# Patient Record
Sex: Female | Born: 1995 | Race: White | Hispanic: No | Marital: Single | Smoking: Former smoker
Health system: Southern US, Community
[De-identification: ages and names within clinical notes are randomized; demographics above are authoritative.]

## PROBLEM LIST (undated history)

## (undated) DIAGNOSIS — Z87820 Personal history of traumatic brain injury: Secondary | ICD-10-CM

## (undated) DIAGNOSIS — J0301 Acute recurrent streptococcal tonsillitis: Secondary | ICD-10-CM

## (undated) DIAGNOSIS — F32A Depression, unspecified: Secondary | ICD-10-CM

## (undated) DIAGNOSIS — I729 Aneurysm of unspecified site: Secondary | ICD-10-CM

## (undated) DIAGNOSIS — F329 Major depressive disorder, single episode, unspecified: Secondary | ICD-10-CM

## (undated) DIAGNOSIS — K589 Irritable bowel syndrome without diarrhea: Secondary | ICD-10-CM

## (undated) DIAGNOSIS — J302 Other seasonal allergic rhinitis: Secondary | ICD-10-CM

## (undated) DIAGNOSIS — F909 Attention-deficit hyperactivity disorder, unspecified type: Secondary | ICD-10-CM

## (undated) DIAGNOSIS — G43909 Migraine, unspecified, not intractable, without status migrainosus: Secondary | ICD-10-CM

## (undated) DIAGNOSIS — S0300XA Dislocation of jaw, unspecified side, initial encounter: Secondary | ICD-10-CM

## (undated) DIAGNOSIS — F419 Anxiety disorder, unspecified: Secondary | ICD-10-CM

## (undated) HISTORY — DX: Attention-deficit hyperactivity disorder, unspecified type: F90.9

## (undated) HISTORY — DX: Major depressive disorder, single episode, unspecified: F32.9

## (undated) HISTORY — DX: Depression, unspecified: F32.A

## (undated) HISTORY — DX: Irritable bowel syndrome, unspecified: K58.9

## (undated) HISTORY — DX: Dislocation of jaw, unspecified side, initial encounter: S03.00XA

## (undated) HISTORY — DX: Aneurysm of unspecified site: I72.9

## (undated) HISTORY — DX: Migraine, unspecified, not intractable, without status migrainosus: G43.909

## (undated) HISTORY — PX: WISDOM TOOTH EXTRACTION: SHX21

## (undated) HISTORY — DX: Anxiety disorder, unspecified: F41.9

## (undated) HISTORY — PX: INTRAUTERINE DEVICE (IUD) INSERTION: SHX5877

---
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
## 2014-01-01 ENCOUNTER — Encounter: Payer: Self-pay | Admitting: *Deleted

## 2014-01-30 ENCOUNTER — Other Ambulatory Visit: Payer: Self-pay | Admitting: Physician Assistant

## 2014-01-30 DIAGNOSIS — F988 Other specified behavioral and emotional disorders with onset usually occurring in childhood and adolescence: Secondary | ICD-10-CM

## 2014-01-30 MED ORDER — LISDEXAMFETAMINE DIMESYLATE 50 MG PO CAPS: 50.0000 mg | ORAL_CAPSULE | Freq: Every day | ORAL | Status: DC

## 2014-02-15 ENCOUNTER — Encounter: Payer: Self-pay | Admitting: Physician Assistant

## 2014-02-15 ENCOUNTER — Ambulatory Visit (INDEPENDENT_AMBULATORY_CARE_PROVIDER_SITE_OTHER): Payer: BC Managed Care – PPO | Admitting: Physician Assistant

## 2014-02-15 VITALS — BP 100/64 | HR 90 | Temp 98.0°F | Resp 16 | Ht 63.5 in | Wt 137.0 lb

## 2014-02-15 DIAGNOSIS — F411 Generalized anxiety disorder: Secondary | ICD-10-CM

## 2014-02-15 DIAGNOSIS — F32A Depression, unspecified: Secondary | ICD-10-CM

## 2014-02-15 DIAGNOSIS — R5383 Other fatigue: Secondary | ICD-10-CM

## 2014-02-15 DIAGNOSIS — F988 Other specified behavioral and emotional disorders with onset usually occurring in childhood and adolescence: Secondary | ICD-10-CM

## 2014-02-15 DIAGNOSIS — F329 Major depressive disorder, single episode, unspecified: Secondary | ICD-10-CM

## 2014-02-15 DIAGNOSIS — F909 Attention-deficit hyperactivity disorder, unspecified type: Secondary | ICD-10-CM

## 2014-02-15 DIAGNOSIS — Z79899 Other long term (current) drug therapy: Secondary | ICD-10-CM

## 2014-02-15 DIAGNOSIS — G43809 Other migraine, not intractable, without status migrainosus: Secondary | ICD-10-CM

## 2014-02-15 LAB — HEPATIC FUNCTION PANEL
ALT: <8 U/L (ref 0–35)
AST: 15 U/L (ref 0–37)
Albumin: 4.2 g/dL (ref 3.5–5.2)
Alkaline Phosphatase: 65 U/L (ref 39–117)
Bilirubin, Direct: 0.1 mg/dL (ref 0.0–0.3)
Indirect Bilirubin: 0.1 mg/dL — ABNORMAL LOW (ref 0.2–1.1)
Total Bilirubin: 0.2 mg/dL (ref 0.2–1.1)
Total Protein: 6.8 g/dL (ref 6.0–8.3)

## 2014-02-15 LAB — CBC WITH DIFFERENTIAL/PLATELET
Basophils Absolute: 0 10*3/uL (ref 0.0–0.1)
Basophils Relative: 0 % (ref 0–1)
Eosinophils Absolute: 0.1 10*3/uL (ref 0.0–0.7)
Eosinophils Relative: 2 % (ref 0–5)
HCT: 37.7 % (ref 36.0–46.0)
Hemoglobin: 12.7 g/dL (ref 12.0–15.0)
Lymphocytes Relative: 36 % (ref 12–46)
Lymphs Abs: 2.4 10*3/uL (ref 0.7–4.0)
MCH: 29.7 pg (ref 26.0–34.0)
MCHC: 33.7 g/dL (ref 30.0–36.0)
MCV: 88.1 fL (ref 78.0–100.0)
MPV: 8.9 fL — ABNORMAL LOW (ref 9.4–12.4)
Monocytes Absolute: 0.8 10*3/uL (ref 0.1–1.0)
Monocytes Relative: 12 % (ref 3–12)
Neutro Abs: 3.3 10*3/uL (ref 1.7–7.7)
Neutrophils Relative %: 50 % (ref 43–77)
Platelets: 455 10*3/uL — ABNORMAL HIGH (ref 150–400)
RBC: 4.28 MIL/uL (ref 3.87–5.11)
RDW: 13.3 % (ref 11.5–15.5)
WBC: 6.6 10*3/uL (ref 4.0–10.5)

## 2014-02-15 LAB — BASIC METABOLIC PANEL WITH GFR
BUN: 11 mg/dL (ref 6–23)
CO2: 27 mEq/L (ref 19–32)
Calcium: 9.5 mg/dL (ref 8.4–10.5)
Chloride: 102 mEq/L (ref 96–112)
Creat: 0.74 mg/dL (ref 0.50–1.10)
GFR, Est African American: >89 mL/min
GFR, Est Non African American: >89 mL/min
Glucose, Bld: 75 mg/dL (ref 70–99)
Potassium: 4.2 mEq/L (ref 3.5–5.3)
Sodium: 137 mEq/L (ref 135–145)

## 2014-02-15 LAB — TSH: TSH: 2.615 u[IU]/mL (ref 0.350–4.500)

## 2014-02-15 MED ORDER — LISDEXAMFETAMINE DIMESYLATE 50 MG PO CAPS: 50.0000 mg | ORAL_CAPSULE | Freq: Every day | ORAL | Status: DC

## 2014-02-15 NOTE — Progress Notes (Signed)
Subjective:    Patient ID: Derrek GuKrista A Fleer, female    DOB: 08/13/1995, 18 y.o.   MRN: [id number]  HPI A Caucasian 18yo female presents to the office today due to wanting to get MRI due to possible concussion that occurred 3 weeks ago and migraines.  Patient also wants to discuss her ADD and depression medication.    Patient states she fell and hit her head hard about 3 weeks ago.  She states she went to provider at [facility] and they told her she had concussion and to do "brain rest" for 5 days; so patient missed 5 days of school.  Patient states she is doing better and does not have symptoms anymore.  Patient and her mother are concerned about the chornic migraines that are occuring and want to get MRI and scheduled appointment with Dr. Clarisse GougeLewit (Neurologist) on 02/28/14.  Patient states the migraines come and go.  Patient states her headaches occur like "band around head".    Patient has [facility] August 2015 and was put on Wellbutrin.  Patient states insurance only covered two visits and she has not gone back due to cost.  She states the Wellbutrin helps with depression and helped "stabilize her mood", but not the anxiety.  Patient also has ADD and was on Adderall 20mg , but is now on VyVanse.  She states the Vyvanse is not working and she thinks it is causing migraines, end of day headache and anxiety.  She states it only works for 2 hours and it makes her "feel bad" with no benefits.  She states she was taking Adderall 20mg - 2 tablets in morning and 2 tablets in afternoon.  She states she want to be put back on Adderall, so that she can control it and take the medication when needed. GFR= >89 on 06/28/13 LMP= 01/23/14.  Cycles are regular.  She is sexually active.  Not on birth control, but states she does use condoms.   Review of Systems  Constitutional: Positive for fatigue. Negative for fever, chills and diaphoresis.  Eyes: Negative.   Respiratory: Negative.    Cardiovascular: Positive for palpitations. Negative for chest pain.  Gastrointestinal: Negative.   Genitourinary: Negative.   Skin: Negative.  Negative for rash.  Neurological: Positive for headaches. Negative for dizziness.  Psychiatric/Behavioral: Negative for suicidal ideas. The patient is nervous/anxious.        Depression and ADD.  Denies SI or HI thoughts.   Past Medical History  Diagnosis Date  . Asthma   . Migraines   . IBS (irritable bowel syndrome)   . H/O cold sores      Medication List       This list is accurate as of: 02/15/14  9:58 AM.  Always use your most recent med list.               acyclovir 400 MG tablet  Commonly known as:  ZOVIRAX  Take 400 mg by mouth 2 (two) times daily.     buPROPion 150 MG 24 hr tablet  Commonly known as:  WELLBUTRIN XL  Take 150 mg by mouth daily.     lisdexamfetamine 50 MG capsule  Commonly known as:  VYVANSE  Take 1 capsule (50 mg total) by mouth daily.       Allergies  Allergen Reactions  . Penicillins   . Sulfa Antibiotics      BP 100/64 mmHg  Pulse 90  Temp(Src) 98 F (36.7 C) (Temporal)  Resp 16  Ht 5' 3.5" (1.613 m)  Wt 137 lb (62.143 kg)  BMI 23.88 kg/m2  SpO2 98%  LMP 01/23/2014 Wt Readings from Last 3 Encounters:  02/15/14 137 lb (62.143 kg) (65 %*, Z = 0.38)  06/28/13 118 lb 3.2 oz (53.615 kg) (32 %*, Z = -0.46)  05/03/13 129 lb (58.514 kg) (55 %*, Z = 0.12)   * Growth percentiles are based on CDC 2-20 Years data.   Objective:   Physical Exam  Constitutional: She is oriented to person, place, and time. She appears well-developed and well-nourished. She does not have a sickly appearance. No distress.  HENT:  Head: Normocephalic.  Right Ear: Tympanic membrane, external ear and ear canal normal.  Left Ear: Tympanic membrane, external ear and ear canal normal.  Nose: Nose normal. Right sinus exhibits no maxillary sinus tenderness and no frontal sinus tenderness. Left sinus exhibits no maxillary  sinus tenderness and no frontal sinus tenderness.  Mouth/Throat: Uvula is midline, oropharynx is clear and moist and mucous membranes are normal. Mucous membranes are not pale and not dry. No trismus in the jaw. No uvula swelling. No oropharyngeal exudate, posterior oropharyngeal edema, posterior oropharyngeal erythema or tonsillar abscesses.  Eyes: Conjunctivae, EOM and lids are normal. Pupils are equal, round, and reactive to light. Right eye exhibits no discharge. Left eye exhibits no discharge. No scleral icterus.  Neck: Trachea normal, normal range of motion and phonation normal. Neck supple. No tracheal tenderness present. No tracheal deviation present. No thyroid mass and no thyromegaly present.  Cardiovascular: Normal rate, regular rhythm, S1 normal, S2 normal, normal heart sounds, intact distal pulses and normal pulses.  Exam reveals no gallop, no distant heart sounds and no friction rub.   No murmur heard. Pulmonary/Chest: Effort normal and breath sounds normal. No stridor. No respiratory distress. She has no decreased breath sounds. She has no wheezes. She has no rhonchi. She has no rales. She exhibits no tenderness.  Abdominal: Soft. Bowel sounds are normal. She exhibits no distension, no pulsatile midline mass and no mass. There is no tenderness. There is no rebound and no guarding.  Lymphadenopathy:  No tenderness or LAD.  Neurological: She is alert and oriented to person, place, and time. She has normal strength. No cranial nerve deficit or sensory deficit. Coordination and gait normal.  Skin: Skin is warm, dry and intact. No rash noted. She is not diaphoretic.  Psychiatric: She has a normal mood and affect. Her speech is normal and behavior is normal. Judgment and thought content normal. Cognition and memory are normal.  Vitals reviewed.  Assessment & Plan:  1. Other migraine without status migrainosus, not intractable Please keep your appt with Neurologist on 02/28/14.  Told patient  that we do not order MRIs on patients when they are going to see Neurologist.  Especially when neuro exam is normal.  2. ADD (attention deficit disorder) -Continue Vyvanse as prescribed -Gave patient paper prescription post dated for 02/28/14 due to patient living on campus and holidays. Told patient that post dating RX can only be done once- lisdexamfetamine (VYVANSE) 50 MG capsule; Take 1 capsule (50 mg total) by mouth daily.  Dispense: 30 capsule; Refill: 0  3. Generalized anxiety disorder and Depression and Fatigue Continue Wellbutrin XL as prescribed.  Ordered TSH to check thyroid since last checked 7 months ago. - TSH  4. Encounter for long-term (current) use of medications Will monitor kidney and liver function. - TSH - BASIC METABOLIC PANEL WITH GFR - Hepatic  function panel - CBC with Differential  Told patient that she needs to see psychiatrist (Dr. Evelene Croon or [facility]) before next appointment/refill.  Told her that they can help manage her Depression, Anxiety and ADD medications better than we can especially due to her symptoms/side effects from medications.  Discussed medication effects and SE's.  Pt agreed to treatment plan. Please keep your physical appt on 07/04/14.  Avni Traore, Lise Auer, PA-C 10:32 AM [facility]

## 2014-02-15 NOTE — Patient Instructions (Addendum)
-Continue all medications as prescribed  Please call one of the psychiatrists below before your next appointment.  They can help manage your medications better than we can as they are the experts.  Dr. Milagros Evenerupinder Kaur [phone]- [phone]  Recommend that you see [facility] to help manage your depression, anxiety and stress better. Address: [address]     [place], [place] [address] [phone]  Please keep your appointment with Neurologist on 02/28/14.  The neurologist are particular when it comes to ordering MRIs and they are faster at getting insurance to cover costs for MRIs.  Please keep your physical in May 2016  Attention Deficit Hyperactivity Disorder Attention deficit hyperactivity disorder (ADHD) is a problem with behavior issues based on the way the brain functions (neurobehavioral disorder). It is a common reason for behavior and academic problems in school. SYMPTOMS  There are 3 types of ADHD. The 3 types and some of the symptoms include:  Inattentive.  Gets bored or distracted easily.  Loses or forgets things. Forgets to hand in homework.  Has trouble organizing or completing tasks.  Difficulty staying on task.  An inability to organize daily tasks and school work.  Leaving projects, chores, or homework unfinished.  Trouble paying attention or responding to details. Careless mistakes.  Difficulty following directions. Often seems like is not listening.  Dislikes activities that require sustained attention (like chores or homework).  Hyperactive-impulsive.  Feels like it is impossible to sit still or stay in a seat. Fidgeting with hands and feet.  Trouble waiting turn.  Talking too much or out of turn. Interruptive.  Speaks or acts impulsively.  Aggressive, disruptive behavior.  Constantly busy or on the go; noisy.  Often leaves seat when they are expected to remain seated.  Often runs or climbs where it is not appropriate, or feels  very restless.  Combined.  Has symptoms of both of the above. Often children with ADHD feel discouraged about themselves and with school. They often perform well below their abilities in school. As children get older, the excess motor activities can calm down, but the problems with paying attention and staying organized persist. Most children do not outgrow ADHD but with good treatment can learn to cope with the symptoms. DIAGNOSIS  When ADHD is suspected, the diagnosis should be made by professionals trained in ADHD. This professional will collect information about the individual suspected of having ADHD. Information must be collected from various settings where the person lives, works, or attends school.  Diagnosis will include:  Confirming symptoms began in childhood.  Ruling out other reasons for the child's behavior.  The health care providers will check with the child's school and check their medical records.  They will talk to teachers and parents.  Behavior rating scales for the child will be filled out by those dealing with the child on a daily basis. A diagnosis is made only after all information has been considered. TREATMENT  Treatment usually includes behavioral treatment, tutoring or extra support in school, and stimulant medicines. Because of the way a person's brain works with ADHD, these medicines decrease impulsivity and hyperactivity and increase attention. This is different than how they would work in a person who does not have ADHD. Other medicines used include antidepressants and certain blood pressure medicines. Most experts agree that treatment for ADHD should address all aspects of the person's functioning. Along with medicines, treatment should include structured classroom management at school. Parents should reward good behavior, provide constant discipline, and  set limits. Tutoring should be available for the child as needed. ADHD is a lifelong condition. If  untreated, the disorder can have long-term serious effects into adolescence and adulthood. HOME CARE INSTRUCTIONS   Often with ADHD there is a lot of frustration among family members dealing with the condition. Blame and anger are also feelings that are common. In many cases, because the problem affects the family as a whole, the entire family may need help. A therapist can help the family find better ways to handle the disruptive behaviors of the person with ADHD and promote change. If the person with ADHD is young, most of the therapist's work is with the parents. Parents will learn techniques for coping with and improving their child's behavior. Sometimes only the child with the ADHD needs counseling. Your health care providers can help you make these decisions.  Children with ADHD may need help learning how to organize. Some helpful tips include:  Keep routines the same every day from wake-up time to bedtime. Schedule all activities, including homework and playtime. Keep the schedule in a place where the person with ADHD will often see it. Mark schedule changes as far in advance as possible.  Schedule outdoor and indoor recreation.  Have a place for everything and keep everything in its place. This includes clothing, backpacks, and school supplies.  Encourage writing down assignments and bringing home needed books. Work with your child's teachers for assistance in organizing school work.  Offer your child a well-balanced diet. Breakfast that includes a balance of whole grains, protein, and fruits or vegetables is especially important for school performance. Children should avoid drinks with caffeine including:  Soft drinks.  Coffee.  Tea.  However, some older children (adolescents) may find these drinks helpful in improving their attention. Because it can also be common for adolescents with ADHD to become addicted to caffeine, talk with your health care provider about what is a safe amount  of caffeine intake for your child.  Children with ADHD need consistent rules that they can understand and follow. If rules are followed, give small rewards. Children with ADHD often receive, and expect, criticism. Look for good behavior and praise it. Set realistic goals. Give clear instructions. Look for activities that can foster success and self-esteem. Make time for pleasant activities with your child. Give lots of affection.  Parents are their children's greatest advocates. Learn as much as possible about ADHD. This helps you become a stronger and better advocate for your child. It also helps you educate your child's teachers and instructors if they feel inadequate in these areas. Parent support groups are often helpful. A national group with local chapters is called Children and Adults with Attention Deficit Hyperactivity Disorder (CHADD). SEEK MEDICAL CARE IF:  Your child has repeated muscle twitches, cough, or speech outbursts.  Your child has sleep problems.  Your child has a marked loss of appetite.  Your child develops depression.  Your child has new or worsening behavioral problems.  Your child develops dizziness.  Your child has a racing heart.  Your child has stomach pains.  Your child develops headaches. SEEK IMMEDIATE MEDICAL CARE IF:  Your child has been diagnosed with depression or anxiety and the symptoms seem to be getting worse.  Your child has been depressed and suddenly appears to have increased energy or motivation.  You are worried that your child is having a bad reaction to a medication he or she is taking for ADHD. Document Released: 02/06/2002 Document Revised:  02/21/2013 Document Reviewed: 10/24/2012 [facility] Patient Information 2015 [place], [place]. This information is not intended to replace advice given to you by your health care provider. Make sure you discuss any questions you have with your health care provider.

## 2014-02-21 ENCOUNTER — Ambulatory Visit: Payer: Self-pay | Admitting: Physician Assistant

## 2014-02-26 ENCOUNTER — Other Ambulatory Visit: Payer: Self-pay

## 2014-02-26 ENCOUNTER — Other Ambulatory Visit: Payer: Self-pay | Admitting: Physician Assistant

## 2014-02-27 ENCOUNTER — Other Ambulatory Visit: Payer: Self-pay

## 2014-02-27 MED ORDER — BUPROPION HCL ER (XL) 150 MG PO TB24: ORAL_TABLET | ORAL | Status: DC

## 2014-02-28 ENCOUNTER — Other Ambulatory Visit: Payer: Self-pay | Admitting: Neurology

## 2014-02-28 DIAGNOSIS — R51 Headache: Principal | ICD-10-CM

## 2014-02-28 DIAGNOSIS — R519 Headache, unspecified: Secondary | ICD-10-CM

## 2014-03-01 ENCOUNTER — Ambulatory Visit
Admission: RE | Admit: 2014-03-01 | Discharge: 2014-03-01 | Disposition: A | Discharge status: Home / Self Care | Payer: BC Managed Care – PPO | Source: Ambulatory Visit | Attending: Neurology | Admitting: Neurology

## 2014-03-01 DIAGNOSIS — R519 Headache, unspecified: Secondary | ICD-10-CM

## 2014-03-01 DIAGNOSIS — R51 Headache: Principal | ICD-10-CM

## 2014-03-20 ENCOUNTER — Encounter: Payer: Self-pay | Admitting: Physician Assistant

## 2014-07-04 ENCOUNTER — Encounter: Payer: Self-pay | Admitting: Physician Assistant

## 2014-08-09 ENCOUNTER — Encounter: Payer: Self-pay | Admitting: Sports Medicine

## 2014-08-09 ENCOUNTER — Encounter: Payer: Self-pay | Admitting: Internal Medicine

## 2014-08-09 ENCOUNTER — Ambulatory Visit (INDEPENDENT_AMBULATORY_CARE_PROVIDER_SITE_OTHER): Payer: BLUE CROSS/BLUE SHIELD | Admitting: Internal Medicine

## 2014-08-09 ENCOUNTER — Emergency Department (HOSPITAL_COMMUNITY): Payer: BLUE CROSS/BLUE SHIELD

## 2014-08-09 ENCOUNTER — Encounter (HOSPITAL_COMMUNITY): Payer: Self-pay | Admitting: *Deleted

## 2014-08-09 ENCOUNTER — Emergency Department (HOSPITAL_COMMUNITY)
Admission: EM | Admit: 2014-08-09 | Discharge: 2014-08-09 | Disposition: A | Discharge status: Home / Self Care | Payer: BLUE CROSS/BLUE SHIELD | Attending: Emergency Medicine | Admitting: Emergency Medicine

## 2014-08-09 VITALS — BP 106/70 | HR 100 | Temp 97.9°F | Resp 16

## 2014-08-09 DIAGNOSIS — S32030A Wedge compression fracture of third lumbar vertebra, initial encounter for closed fracture: Secondary | ICD-10-CM | POA: Diagnosis not present

## 2014-08-09 DIAGNOSIS — S06320S Contusion and laceration of left cerebrum without loss of consciousness, sequela: Secondary | ICD-10-CM

## 2014-08-09 DIAGNOSIS — K589 Irritable bowel syndrome without diarrhea: Secondary | ICD-10-CM | POA: Insufficient documentation

## 2014-08-09 DIAGNOSIS — R200 Anesthesia of skin: Secondary | ICD-10-CM | POA: Diagnosis present

## 2014-08-09 DIAGNOSIS — Y9289 Other specified places as the place of occurrence of the external cause: Secondary | ICD-10-CM | POA: Insufficient documentation

## 2014-08-09 DIAGNOSIS — W1839XA Other fall on same level, initial encounter: Secondary | ICD-10-CM | POA: Diagnosis not present

## 2014-08-09 DIAGNOSIS — Z8619 Personal history of other infectious and parasitic diseases: Secondary | ICD-10-CM | POA: Diagnosis not present

## 2014-08-09 DIAGNOSIS — R531 Weakness: Secondary | ICD-10-CM | POA: Insufficient documentation

## 2014-08-09 DIAGNOSIS — G43909 Migraine, unspecified, not intractable, without status migrainosus: Secondary | ICD-10-CM | POA: Insufficient documentation

## 2014-08-09 DIAGNOSIS — S0210XS Unspecified fracture of base of skull, sequela: Secondary | ICD-10-CM

## 2014-08-09 DIAGNOSIS — S22080A Wedge compression fracture of T11-T12 vertebra, initial encounter for closed fracture: Secondary | ICD-10-CM | POA: Insufficient documentation

## 2014-08-09 DIAGNOSIS — Z79899 Other long term (current) drug therapy: Secondary | ICD-10-CM | POA: Insufficient documentation

## 2014-08-09 DIAGNOSIS — M4850XA Collapsed vertebra, not elsewhere classified, site unspecified, initial encounter for fracture: Secondary | ICD-10-CM

## 2014-08-09 DIAGNOSIS — S22000S Wedge compression fracture of unspecified thoracic vertebra, sequela: Secondary | ICD-10-CM

## 2014-08-09 DIAGNOSIS — Z880 Allergy status to penicillin: Secondary | ICD-10-CM | POA: Diagnosis not present

## 2014-08-09 DIAGNOSIS — Y9389 Activity, other specified: Secondary | ICD-10-CM | POA: Insufficient documentation

## 2014-08-09 DIAGNOSIS — IMO0001 Reserved for inherently not codable concepts without codable children: Secondary | ICD-10-CM

## 2014-08-09 DIAGNOSIS — S06310S Contusion and laceration of right cerebrum without loss of consciousness, sequela: Secondary | ICD-10-CM

## 2014-08-09 DIAGNOSIS — Y998 Other external cause status: Secondary | ICD-10-CM | POA: Insufficient documentation

## 2014-08-09 MED ORDER — ONDANSETRON HCL 4 MG/2ML IJ SOLN
4.0000 mg | Freq: Once | INTRAMUSCULAR | Status: AC
  Administered 2014-08-09: 4 mg via INTRAVENOUS
  Filled 2014-08-09: qty 2

## 2014-08-09 MED ORDER — LORAZEPAM 2 MG/ML IJ SOLN
1.0000 mg | Freq: Once | INTRAMUSCULAR | Status: AC
  Administered 2014-08-09: 1 mg via INTRAVENOUS
  Filled 2014-08-09: qty 1

## 2014-08-09 MED ORDER — MORPHINE SULFATE 4 MG/ML IJ SOLN
4.0000 mg | Freq: Once | INTRAMUSCULAR | Status: AC
  Administered 2014-08-09: 4 mg via INTRAVENOUS
  Filled 2014-08-09: qty 1

## 2014-08-09 NOTE — Progress Notes (Signed)
Subjective:    Patient ID: Hannah Reeves, female    DOB: 06-04-1995, 19 y.o.   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 She had no admitted LOC, nor focal neuro signs or sx's and was admitted to a trauma unit, then step and finally a regular floor as she was observred and activity was advanced. Then 7 July she was released and wearing a TLSO brace she reportedly had a very uncomfortable trip back to [employer]. She presents today with c/o new pains  & weakness of her lower extremities - more specifically her medial & lateral thighs . She did ambulate here today and arrived in the examine room via wheel chair. She's c/o a global HA w/o focal signs.     Numerous CTscans, MRI and XR reports were reviewed from here recent hospitalization from accompanying CD's from her recent hospitalization..  Medication Sig  . buPROPion XL 150 MG 24 hr tablet TAKE 1 TABLET BY MOUTH ONCE EVERY MORNING (Patient taking differently: Take 150 mg by mouth daily. )  . acyclovir  400 MG tablet Take 400 mg by mouth 2 (two) times daily.  Marland Kitchen lisdexamfetamine50 MG capsule Take 1 capsule (50 mg total) by mouth daily.   Allergies  Allergen Reactions  . Penicillins   . Sulfa Antibiotics    Past Medical History  Diagnosis Date  . Migraines   . IBS (irritable bowel syndrome)   . H/O cold sores   . History of spinal fracture     T12 compression fracture   Review of Systems In addition to the HPI above,  No Fever-chills,  No changes with Vision or hearing,  No problems swallowing food or Liquids,  No Chest pain or productive Cough or Shortness of Breath,  No Abdominal pain, No Nausea or Vomitting, Bowel movements are regular,  No Blood in stool or Urine,   No dysuria,  No new skin rashes or bruises,  No new joints pains-aches,  No polyuria, polydypsia or polyphagia,  No significant Mental Stressors.  A full 10 point Review of Systems was done, except as stated above, all other Review of Systems were negative    Objective:   Physical Exam  Mother in attendance during the whole visit   BP 106/70 mmHg  Pulse 100  Temp(Src) 97.9 F (36.6 C)  Resp 16  LMP 07/29/2014  Appears uncomfortable. Tender to light touch about the head globally with patient holding her eyes shut. In TLSO brace.  Neg Battle's sign.  HEENT - Eac's patent. Rt Tm Nl & Lt obscured by cerumen. . EOM's full. PERRLA. NasoOroPharynx clear. Neck - supple. Tender to light touch over the whole neck.  Chest - Clear equal BS w/o Rales, rhonchi, wheezes. Cor - Nl HS. RRR w/o sig MGR. PP 1(+). No edema. Abd - No palpable organomegaly, masses or tenderness. BS nl. MS- FROM w/o obvious deformities.  deformities. Muscle power, tone and bulk Nl. Gait Nl. Bilat . Hip ROM Nl. Flexor/extensor strength of LE seems Nl/equal.  Neuro - No obvious Cr N abnormalities. Sensory, motor and Cerebellar functions appear Nl w/o focal abnormalities. DTR's of LE's Nl .  Psyche -Patient is  somewhat distraught.     Assessment & Plan:   1. Basilar skull fracture, sequela   2. Frontal lobe contusion, right, without loss of consciousness, sequela   3. Frontal lobe contusion, left, without loss of consciousness, sequela   4. Thoracic compression fracture, sequela  - Discussed with patient & mother & recommended transport to [facility] ER for supplemental imaging of the thoraco-lumbar spine to r/o delayed cord compression from recent injuries. Patient declined EMS transport as she felt able to ambulate.Over 25 minutes of exam, counseling, chart review and complex critical decision making was performed.

## 2014-08-09 NOTE — ED Notes (Signed)
Pt discharged after 1 week from trauma center in [place] after being tx for T12 fx and skull fx.  Pt now experiencing burning in her legs.  Pt was sent here by pcp to be seen neurosurgeon for these new symptoms.

## 2014-08-09 NOTE — ED Notes (Signed)
Off unit with MRI 

## 2014-08-09 NOTE — ED Provider Notes (Addendum)
CSN: [id number]     Arrival date & time 08/09/14  1143 History   First MD Initiated Contact with Patient 08/09/14 1205     Chief Complaint  Patient presents with  . Numbness     (Consider location/radiation/quality/duration/timing/severity/associated sxs/prior Treatment) HPI Comments: Patient with a recent history of an 8 foot fall backwards off of a balcony sustaining a skull fracture, intracranial hemorrhage and a T12 compression fracture. She was hospitalized for 1 week in [place] in discharged 2 days ago. She was sent home with a TLSO brace. She has been wearing the brace constantly and the drive home from [place] yesterday tried to lay for a majority of the trip. However she started to experience a burning pain in the posterior segments of both legs worse when trying to ambulate. Also today her legs started to feel weak and her mom had to support her. Pain and symptoms are the best when lying flat. She still taking Percocet and Flexeril. She denies any urinary retention or bowel incontinence. She has had an ongoing headache since the fall but denies feeling that it's any different than what it has been. She denies any nausea or vomiting at this time.  The history is provided by the patient.    Past Medical History  Diagnosis Date  . Migraines   . IBS (irritable bowel syndrome)   . H/O cold sores   . History of spinal fracture     T12 compression fracture   Past Surgical History  Procedure Laterality Date  . No past surgeries     Family History  Problem Relation Age of Onset  . Allergies Mother    History  Substance Use Topics  . Smoking status: Never Smoker   . Smokeless tobacco: Not on file  . Alcohol Use: No   OB History    No data available     Review of Systems  All other systems reviewed and are negative.     Allergies  Penicillins and Sulfa antibiotics  Home Medications   Prior to Admission medications   Medication Sig Start Date End Date Taking?  Authorizing Provider  acyclovir (ZOVIRAX) 400 MG tablet Take 400 mg by mouth 2 (two) times daily.    Historical Provider, MD  buPROPion (WELLBUTRIN XL) 150 MG 24 hr tablet TAKE 1 TABLET BY MOUTH ONCE EVERY MORNING 02/27/14   Lucky Cowboy, MD  cyclobenzaprine (FLEXERIL) 10 MG tablet Take 10 mg by mouth 3 (three) times daily as needed for muscle spasms.    Historical Provider, MD  escitalopram (LEXAPRO) 10 MG tablet Take 10 mg by mouth daily.    Historical Provider, MD  oxyCODONE-acetaminophen (PERCOCET/ROXICET) 5-325 MG per tablet Take by mouth every 4 (four) hours as needed for severe pain.    Historical Provider, MD   BP 109/76 mmHg  Pulse 82  Temp(Src) 97.9 F (36.6 C) (Oral)  Resp 16  Ht 5\' 3"  (1.6 m)  Wt 125 lb (56.7 kg)  BMI 22.15 kg/m2  SpO2 100%  LMP 07/29/2014 Physical Exam  Constitutional: She is oriented to person, place, and time. She appears well-developed and well-nourished. No distress.  HENT:  Head: Normocephalic and atraumatic.  Mouth/Throat: Oropharynx is clear and moist.  Eyes: Conjunctivae and EOM are normal. Pupils are equal, round, and reactive to light.  Neck: Normal range of motion. Neck supple.  Cardiovascular: Normal rate, regular rhythm and intact distal pulses.   No murmur heard. Pulmonary/Chest: Effort normal and breath sounds normal. No respiratory distress. She  has no wheezes. She has no rales.  Abdominal: Soft. She exhibits no distension. There is no tenderness. There is no rebound and no guarding.  Musculoskeletal: Normal range of motion. She exhibits no edema or tenderness.       Thoracic back: She exhibits bony tenderness.       Back:  Neurological: She is alert and oriented to person, place, and time.  5 out of 5 strength to bilateral lower extremities. Normal sensation bilaterally. Negative Babinski. Pain exacerbated with strength testing on bilateral lower extremities. Left greater than right.  Skin: Skin is warm and dry. No rash noted. No  erythema.  Psychiatric: She has a normal mood and affect. Her behavior is normal.  Nursing note and vitals reviewed.   ED Course  Procedures (including critical care time) Labs Review Labs Reviewed - No data to display  Imaging Review Mr Lumbar Spine Wo Contrast  08/09/2014   CLINICAL DATA:  19 year old female fell 6 days ago with T12 compression fracture. [place] out of state and drove home yesterday. New onset a burning pain and back and both legs extending to knees. Initial encounter.  EXAM: MRI LUMBAR SPINE WITHOUT CONTRAST  TECHNIQUE: Multiplanar, multisequence MR imaging of the lumbar spine was performed. No intravenous contrast was administered.  COMPARISON:  No comparison recent exam. Comparison CT abdomen pelvis 01/24/2013.  FINDINGS: Exam is motion degraded.  Last fully open disk space is labeled L5-S1. Present examination incorporates from T9-10 through the lower sacrum. Conus T12-L1 level.  Visualized paravertebral structures unremarkable.  Compression fracture T12 vertebral body most notable superior endplate and to the right of midline. Mild loss of height greater right lateral aspect (20%). Fracture line extends towards the junction with the right pedicle. No significant retropulsion. At most there is minimal buckling of posterior superior margin towards the canal. No epidural hematoma.  Fracture of the superior aspect of the L3 vertebral body with minimal buckling anterior and superior margin. No retropulsion or epidural hematoma.  No evidence of disc herniation.  IMPRESSION: T12 and L3 vertebral body fractures as noted above.   Electronically Signed   By: Lacy Duverney M.D.   On: 08/09/2014 15:20     EKG Interpretation None      MDM   Final diagnoses:  Weakness  Vertebral compression fracture, initial encounter    Patient with recent discharge after a week hospitalization with a known T12 fracture and skull fracture with intercranial bleeding. Patient was hospitalized in  [place] and was discharged 2 days ago. Family brought her back home yesterday after prolonged car ride. Patiently is currently in a TLSO brace and started noticing some burning in her legs the day of discharge. However symptoms have worsened and she has severe burning in the posterior thighs bilaterally and weakness when attempting to walk. She continues to have back pain and a headache which are unchanged since her injury. She is taking Percocet and Flexeril. She saw her regular doctor today who sent her here for further evaluation. Patient has not established care with a neurosurgeon in town yet.  On exam patient has 5 out of 5 strength in bilateral lower extremities. Sensation is intact. Negative Babinski. She denies any urinary or bowel problems. Pain is exacerbated with movement of the lower extremities. Left greater than right. She also has T12 tenderness with palpation.  MRI of the spine pending to rule out worsening of the compression fracture, edema or cord involvement.  3:28 PM MRI with T12 and L3 fractures  without cord involvement.  Will discuss with Dr. Venetia Maxon with neurosurgery.  4:12 PM Spoke with Dr. Venetia Maxon and patient's fractures are stable. However small cord contusion which is most likely the cause of her symptoms. Recommended continuing wearing the brace and using pain medicine and muscle relaxers. Will follow-up with Dr. Venetia Maxon in the office Gwyneth Sprout, MD 08/09/14 1528  Gwyneth Sprout, MD 08/09/14 1612  Gwyneth Sprout, MD 08/09/14 [id number]

## 2014-08-09 NOTE — ED Notes (Signed)
md at bedside

## 2014-08-09 NOTE — Discharge Instructions (Signed)
Back, Compression Fracture °A compression fracture happens when a force is put upon the length of your spine. Slipping and falling on your bottom are examples of such a force. When this happens, sometimes the force is great enough to compress the building blocks (vertebral bodies) of your spine. Although this causes a lot of pain, this can usually be treated at home, unless your caregiver feels hospitalization is needed for pain control. °Your backbone (spinal column) is made up of 24 main vertebral bodies in addition to the sacrum and coccyx (see illustration). These are held together by tough fibrous tissues (ligaments) and by support of your muscles. Nerve roots pass through the openings between the vertebrae. A sudden wrenching move, injury, or a fall may cause a compression fracture of one of the vertebral bodies. This may result in back pain or spread of pain into the belly (abdomen), the buttocks, and down the leg into the foot. Pain may also be created by muscle spasm alone. °Large studies have been undertaken to determine the best possible course of action to help your back following injury and also to prevent future problems. The recommendations are as follows. °FOLLOWING A COMPRESSION FRACTURE: °Do the following only if advised by your caregiver.  °· If a back brace has been suggested or provided, wear it as directed. °· Do not stop wearing the back brace unless instructed by your caregiver. °· When allowed to return to regular activities, avoid a sedentary lifestyle. Actively exercise. Sporadic weekend binges of tennis, racquetball, or waterskiing may actually aggravate or create problems, especially if you are not in condition for that activity. °· Avoid sports requiring sudden body movements until you are in condition for them. Swimming and walking are safer activities. °· Maintain good posture. °· Avoid obesity. °· If not already done, you should have a DEXA scan. Based on the results, be treated for  osteoporosis. °FOLLOWING ACUTE (SUDDEN) INJURY: °· Only take over-the-counter or prescription medicines for pain, discomfort, or fever as directed by your caregiver. °· Use bed rest for only the most extreme acute episode. Prolonged bed rest may aggravate your condition. Ice used for acute conditions is effective. Use a large plastic bag filled with ice. Wrap it in a towel. This also provides excellent pain relief. This may be continuous. Or use it for 30 minutes every 2 hours during acute phase, then as needed. Heat for 30 minutes prior to activities is helpful. °· As soon as the acute phase (the time when your back is too painful for you to do normal activities) is over, it is important to resume normal activities and work hardening programs. Back injuries can cause potentially marked changes in lifestyle. So it is important to attack these problems aggressively. °· See your caregiver for continued problems. He or she can help or refer you for appropriate exercises, physical therapy, and work hardening if needed. °· If you are given narcotic medications for your condition, for the next 24 hours do not: °¨ Drive. °¨ Operate machinery or power tools. °¨ Sign legal documents. °· Do not drink alcohol, or take sleeping pills or other medications that may interfere with treatment. °If your caregiver has given you a follow-up appointment, it is very important to keep that appointment. Not keeping the appointment could result in a chronic or permanent injury, pain, and disability. If there is any problem keeping the appointment, you must call back to this facility for assistance.  °SEEK IMMEDIATE MEDICAL CARE IF: °· You develop numbness,   tingling, weakness, or problems with the use of your arms or legs. °· You develop severe back pain not relieved with medications. °· You have changes in bowel or bladder control. °· You have increasing pain in any areas of the body. °Document Released: 02/16/2005 Document Revised:  07/03/2013 Document Reviewed: 09/21/2007 °[employer]® Patient Information ©2015 [employer]. This information is not intended to replace advice given to you by your health care provider. Make sure you discuss any questions you have with your health care provider. ° °

## 2014-08-09 NOTE — ED Notes (Signed)
Pt fell 8 ft off railing and fell on back of head, compressing T12 and fracture of base of skull x 6 days ago.  Was seen in [place] and drove home yesterday.  Pt has new onset of burning/pain in back of bi lateral legs that shoots from lower back to bi lateral knees.

## 2014-08-15 ENCOUNTER — Other Ambulatory Visit: Payer: Self-pay | Admitting: Neurosurgery

## 2014-08-15 DIAGNOSIS — S065X9A Traumatic subdural hemorrhage with loss of consciousness of unspecified duration, initial encounter: Secondary | ICD-10-CM

## 2014-08-15 DIAGNOSIS — S065XAA Traumatic subdural hemorrhage with loss of consciousness status unknown, initial encounter: Secondary | ICD-10-CM

## 2014-08-20 ENCOUNTER — Other Ambulatory Visit: Payer: Self-pay | Admitting: Neurosurgery

## 2014-08-20 ENCOUNTER — Ambulatory Visit
Admission: RE | Admit: 2014-08-20 | Discharge: 2014-08-20 | Disposition: A | Discharge status: Home / Self Care | Payer: BLUE CROSS/BLUE SHIELD | Source: Ambulatory Visit | Attending: Neurosurgery | Admitting: Neurosurgery

## 2014-08-20 ENCOUNTER — Inpatient Hospital Stay
Admission: RE | Admit: 2014-08-20 | Discharge: 2014-08-20 | Disposition: A | Discharge status: Home / Self Care | Payer: Self-pay | Source: Ambulatory Visit | Attending: Neurosurgery | Admitting: Neurosurgery

## 2014-08-20 DIAGNOSIS — S065X9A Traumatic subdural hemorrhage with loss of consciousness of unspecified duration, initial encounter: Secondary | ICD-10-CM

## 2014-08-20 DIAGNOSIS — S065XAA Traumatic subdural hemorrhage with loss of consciousness status unknown, initial encounter: Secondary | ICD-10-CM

## 2014-09-05 ENCOUNTER — Other Ambulatory Visit: Payer: Self-pay | Admitting: Internal Medicine

## 2014-09-05 ENCOUNTER — Encounter: Payer: Self-pay | Admitting: *Deleted

## 2014-09-12 ENCOUNTER — Encounter: Payer: Self-pay | Admitting: *Deleted

## 2014-10-02 ENCOUNTER — Encounter: Payer: Self-pay | Admitting: *Deleted

## 2014-10-12 ENCOUNTER — Ambulatory Visit: Payer: BLUE CROSS/BLUE SHIELD | Attending: Psychology | Admitting: Psychology

## 2014-10-12 DIAGNOSIS — S069X1S Unspecified intracranial injury with loss of consciousness of 30 minutes or less, sequela: Secondary | ICD-10-CM | POA: Diagnosis not present

## 2014-10-12 DIAGNOSIS — F900 Attention-deficit hyperactivity disorder, predominantly inattentive type: Secondary | ICD-10-CM | POA: Diagnosis not present

## 2014-10-12 DIAGNOSIS — F419 Anxiety disorder, unspecified: Secondary | ICD-10-CM

## 2014-10-12 DIAGNOSIS — F331 Major depressive disorder, recurrent, moderate: Secondary | ICD-10-CM | POA: Diagnosis not present

## 2014-10-15 NOTE — Progress Notes (Addendum)
[facility]  [address]   Telephone [phone] [address] Fax [phone] [place], [place] [address]  Initial Contact Note  Name:  Hannah Reeves Date of Birth; 03-Dec-1995 MRN:  [id number] Date:  10/15/2014  Hannah Reeves is an 19 y.o. 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    A total of 6 hours was spent today reviewing medical records, interviewing (CPT [phone]) Derrek Gu and administering and scoring neurocognitive tests (CPT [phone] & [id number]).  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   A full report will follow once the planned testing has been completed. Her next appointment is scheduled for 10/16/14.    Gladstone Pih, Ph.D Licensed Psychologist 10/15/2014

## 2014-10-16 ENCOUNTER — Ambulatory Visit (INDEPENDENT_AMBULATORY_CARE_PROVIDER_SITE_OTHER): Payer: BLUE CROSS/BLUE SHIELD | Admitting: Psychology

## 2014-10-16 DIAGNOSIS — F900 Attention-deficit hyperactivity disorder, predominantly inattentive type: Secondary | ICD-10-CM | POA: Diagnosis not present

## 2014-10-16 DIAGNOSIS — F419 Anxiety disorder, unspecified: Secondary | ICD-10-CM

## 2014-10-16 DIAGNOSIS — F331 Major depressive disorder, recurrent, moderate: Secondary | ICD-10-CM | POA: Diagnosis not present

## 2014-10-16 DIAGNOSIS — S069X0S Unspecified intracranial injury without loss of consciousness, sequela: Secondary | ICD-10-CM

## 2014-10-16 DIAGNOSIS — R4189 Other symptoms and signs involving cognitive functions and awareness: Secondary | ICD-10-CM

## 2014-10-16 DIAGNOSIS — S069X1S Unspecified intracranial injury with loss of consciousness of 30 minutes or less, sequela: Secondary | ICD-10-CM

## 2014-10-16 DIAGNOSIS — F068 Other specified mental disorders due to known physiological condition: Secondary | ICD-10-CM

## 2014-10-16 NOTE — Progress Notes (Addendum)
[facility]  [address]   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 She also suffered T12 and L3 vertebral compression fractures. She was hospitalized for one week. A CT of the head on 08/20/14 showed expected evolution of the bilateral frontal hemorrhagic contusions. She was referred for neuropsychological evaluation by Maeola Harman, MD of [facility] for an assessment of her cognitive and emotional functioning.   Sources of Information Medical records from Dr. Venetia Maxon, a report from [facility] (obtained from mother) and electronic medical records from the [facility] were reviewed. Ms. Nobile and her mother, Ms. Sherrie Sport, were interviewed.    History of Injury & Chief Complaints Ms. Puffenbarger reported recalling losing her balance while sitting on a railing and falling while on vacation in [place], [place]. She stated that a witness later told her that she was unconscious for seconds to minutes at the scene followed by seeming dazed. Ms. Darty first memory after her injury was of waking up in the hospital about eight hours later. She reported having had fragmentary memory of her one week hospital stay. Her mother described her during that time as mentally lucid though sleepy, which she attributed to medications.   She recollected that over the next few  weeks she experienced a constant headache (which diminished after four weeks), back pain, reduced memory for recent events and fatigue.     Currently, she reports reduced physical and mental stamina as well as infrequent mild headaches. Her back pain has mostly resolved. She cited cognitive symptoms of slowed thinking, problems with memory and trouble finding words while speaking. Of note, she reported a history of difficulties with distractibility, focus, concentration and memory retrieval since childhood that had been attributed to Attention Deficit Hyperactivity Disorder (ADHD). With regards to her emotional functioning, she described herself as more prone to be moody and feel anxious in the context of a prior history of anxiety and depression. She denied post traumatic stress symptoms. She reported having experienced nightmares most nights though to a frequency and of content that was not discrepant from prior to her injury. She acknowledged having occasional fleeting suicidal ideas but denied intent. Her mother stated that she has not observed any changes in her daughter's mood, social comportment, habits or personality since her injury of June 2016.  Background Medical History Her past medical history was notable for migraine headaches, irritable bowel syndrome and gluten intolerance. She reported a prior head injury in a fall off of a bench in December 2015. She reported that she did not lose consciousness but experienced mild headache and nausea over the next two days. A brain MRI on 03/01/14 was reportedly normal. She missed five days of school. She reported no history of seizure activity, stroke-like symptoms, neurological infection or exposure to neurotoxic substances.   Developmental History She reported no history of developmental delays, unusual childhood illnesses or injuries. She reported a history of being inattentive and  forgetful since childhood that was diagnosed within the past year as  representing ADHD.  Mental Health History As noted above, she reported a history of inattentiveness and forgetfulness since childhood. She reported that despite her attentional difficulties she had earned good grades throughout her school career. She reported that she first suspected that she had ADHD while in high school but that her parents did not wish her to pursue evaluation. She was initially diagnosed with ADHD by her primary care physician in 2015. She was prescribed Vyvanse but could not tolerate the side effects. She then took Adderall for about eighteen months. She reported that Adderall helped her to concentrate and made her feel less "scattered". Use of Adderall was suspended by another medical provider pending a formal ADHD evaluation. She was evaluated at [facility] in March 2016 and diagnosed with ADHD, primarily inattentive type as well as depression and anxiety. She was restarted on Adderall.   She reported longtime problems with anxiety and depression, beginning when she was in middle school. She claimed that her parents would not allow her to seek mental health evaluation because of the associated stigma. Her initial mental health contact was with a psychiatrist at [facility] in August 2015. She reported that she was prescribed Wellbutrin, which helped her feel less moody but did not reduce her level of anxiety. She described herself as prone to become anxious in social situations but not to specific objects or consistently to certain activities. She stated her belief that she has obsessive-compulsive tendencies based on her discomfort when objects are out of order or sequence. She stated that she felt a little less anxious after Lexapro was added in March 2016. She has worried in the past about tendencies to make decisions without proper reflection and speak in a blunt manner towards others. She reported no history of mania, suicidal behavior or psychotic symptoms. She went  to some counseling sessions in college but described them as unhelpful.    Substance Abuse History  She reported ongoing, intermittent use of marijuana, which has helped her to quell her anxiety. She reported occasional episodes of excessive alcohol use as an adolescent but did not report a history of ongoing abuse or any problems related to alcohol use. 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   Current Medications Her current medications include acyclovir, bupropion XL, escitalopram and Percocet. She has not taken any psychostimulant medication since her injury.  Social Background Ms. Ulatowski is single. When not at school, she lives with her parents and three younger siblings, ages 50, 70 and 64, in [place], [place].  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 She stated her current grade point average as 2.9. She reported that she has received educational accommodations on the basis of ADHD of being allowed longer time to complete exams and to take them in a testing center.  Legal She reported no history of legal charges, convictions or personal injury litigation.  Observations She appeared as an appropriately dressed and groomed woman of her stated age in no physical apparent distress. Her gait and posture appeared normal. She did not exhibit any unusual mannerisms or motor activity. She did not display any pain behavior. She related in a pleasant and cooperative manner. Her affect appeared within a wide range and without lability. Her mood seemed mildly anxious. She displayed flashes of irritability when her mother was present. Her speech was well  articulated, fluent and of normal prosody. Her word usage seemed normal. She had no apparent problems understanding what was said to her. Her thought processes were coherent and organized. There was no evidence of bizarre or unusual  thought content, perseverative behavior or delusional thinking.  Evaluation Procedures In addition to review of medical records and interviews with Ms. Ding and her mother, the following tests or questionnaires were administered:  Animal Naming Test [facility] Depression Inventory-II [place] Naming Test [place] Verbal Learning Test-II  Conners' Continuous Performance Test II  Controlled Oral Word Association Test Paced Auditory Serial Addition Test Stroop Color and Word Test Trail Making A & B Wechsler Adult Intelligence Scale-IV:   Arithmetic, Coding, Digit Span, Matrix Reasoning & Similarities   Wechsler Memory Scale- IV  Wide Range Achievement Test-4: Word Reading [place] Card Sorting Test Zung Self-Rating Anxiety Scale  Assessment Results Validity & Interpretative Considerations It was concluded that test results represented a valid measure of her cognitive functioning. She did not display signs of inattention, lack of persistence or emotional distress. She complained of stomach pains during the initial testing session, which she attributed to haven recently eaten food containing gluten. She did not report or display problems with vision (she wears contact lenses), hearing or motor skills. She was able to comprehend task instructions without difficulty. She appeared to expend maximal effort.   Her pre-injury intellectual functioning was estimated to fall within the Average range based on demographic factors coupled with a measure of her word reading skill (Wide Range Achievement Test-4: Word Reading). Word reading tends to be highly resistant to the effects of acquired brain dysfunction and therefore offers a reliable estimate of pre-morbid functioning.  Her test scores were corrected to reflect norms for her age and, whenever possible, her gender and educational level (i.e., 14 years). A listing of her test scores can be found at the end of this report.   Intellectual  Functioning Her current level of intellectual functioning was estimated to fall within the Average range based on her performances on measures of word reading (Wide Range Achievement Test-4: Word Reading) and nonverbal reasoning (Wechsler Adult Intelligence Scale-IV (WAIS-IV) Matrix Reasoning), which are considered to be highly correlated with general intelligence. This result was commensurate with her expected level of general cognitive ability based on demographic factors.  Speed of Information Processing & Attention Her ability to sustain visual attention was within normal limits based on her ability to detect targets flashed on a screen (Conners Continuous Performance Test-II). She responded to a typical number of targets with reaction times that were fast as well as consistent in response to changes in stimulus delivery and over the duration of the test. Her only deficiency on this test was her higher than average number of responses to non-targets, which might suggest difficulty with inhibiting responses.  Her sustained attention and information processing speed in the auditory channel, as assessed on a task that required continual mental adding of pairs of digits presented at various rates of speed (Paced Auditory Serial Addition Test), was within normal expectations.  Her performances on brief tests of visual attention that measured her speed to draw lines to connect randomly arrayed numbers in numerical sequence (Trails A) or to transcribe symbols to match numbers using a key (WAIS-IV Coding) varied from the Low Average to Average ranges, respectively. Her speed to read words or name color hues out loud (Stroop Test) was within the Average range.   She performed within the Average range or better on  tests of attentional capacity/working memory that required her to mentally rearrange digit sequences in reverse or ascending sequence (WAIS-IV Digit Span), solve mental calculations (WAIS-IV  Arithmetic), immediately recognize symbols in left to right order (Wechsler Memory Scale-IV (WMS-IV) Symbol Span) or immediately recall spatial locations within a grid (WMS-IV Spatial Addition).  Executive Functions Measures of executive function, which comprise higher level attentional, organizational and reasoning processes that enable a person to successfully initiate, monitor, shift, plan and execute complex behavior, were for the most part within normal expectations. Her speed to shift set by maintaining an ascending and alternating sequence of numbers and letters (Trails B) fell within the Average range. Moreover, she did not deviate from the correct sequence. Her efficiency in naming the print color of a word while simultaneously ignoring the conflicting word (Stroop Color and Word Test), a measure of selective attention and response inhibition, was within the Average range. She did show difficulty with response inhibition on a vigilance task (Conners Continuous Performance Test-II), however. Her performances on tests of verbal fluency were variable as her ability to name members of a category (Animal Naming Test) was subnormal while her ability to generate words to designated letters (Controlled Oral Word Association Test) fell within the High Average range. Her performances on tests of abstract reasoning (WAIS-IV Similarities & WAIS-IV Matrix Reasoning) fell within the Average range. Finally, she performed well on a test of nonverbal problem-solving that required inferring logical ways to sort geometric designs on the basis of ongoing feedback ([employer]) as she quickly inferred the initial sorting principle, deduced all possible sorting ideas, made very few errors, consistently maintained set and adaptively shifted to a new strategy as necessary.  Learning & Memory Her abilities to acquire, store and retrieve new information were within normal expectations. A measure of her ability  to learn and retain orally-presented information (WMS-IV Auditory Memory Index) was a relative strength within the Superior range at the 96th percentile. On a word list recall task [facility] Verbal Learning Test-II) she demonstrated excellent immediate memory span as well as verbal learning to repetition. Her delayed recall of auditory information was as expected given her initial level of encoding. Her ability to learn and retain visual information (WMS-IV Visual Memory Index) was relatively less well developed within the Average range at the 32nd percentile. Her visual memory subtest scores uniformly fell within the Average range. Her delayed recall of visual information was as expected given her initial level of encoding. Overall, a composite measure of her delayed memory for both auditory and visual information (WMS-IV Delayed Memory Index), as assessed after an approximate thirty minute delay interval, fell within the High Average range at the 79th percentile.   Language No qualitative problems were evident for speech articulation, prosody, word finding, word selection, message coherence or language comprehension. Her naming to confrontation [facility]) was lower than expected within the Low Average range. As noted above, her phonemic fluency (Controlled Oral Word Association Test) fell within the High Average range though her semantic fluency (Animal Naming Test) was subnormal. Her word reading skill (Wide Range Achievement Test-4) was within the Average range.  Visuospatial Perception & Organization There were no signs of spatial inattention or problems with visual recognition. Her speed on a test that required visual scanning (Trails A) was within the Low Average range. Her perceptual analytic and reasoning skills were intact based on her ability to match designs in an abstract manner (WAIS-IV Matrix Reasoning). Samples of her drawing and handwriting  appeared normal.   Emotional  Functioning  On the Beck Depression Inventory-II her score of 32 fell within the severe range of depression. She endorsed affective, cognitive and somatic symptoms of depression. Her most prominent symptoms (i.e., 3 on a 0 - 3 scale) were feeling restless or agitated, having trouble making decisions and craving food all the time. She endorsed having had suicidal thoughts within the past two weeks though without intent to carry them out.  On the Zung Self-Rating Anxiety Scale, her score of 55 was consistent with a mild to moderate level of anxiety. Her most frequent symptoms (i.e., most or all of the time) were "get upset easily or feel panicky", feeling like "I'm falling apart and going to pieces", stomachaches or indigestion, and nightmares.    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 She also suffered T12 and L3 compression fractures.  Her neuropsychological functioning, assessed approximately 2 months post-injury, was generally within normal expectations except for reduced ability to retrieve previously learned verbal information from long-term semantic lexicon. This weakness was evident on tests of language that required her to name drawings of objects (confrontational naming) or generate words under time pressure from a specific category (semantic fluency). Otherwise, for the most part she performed commensurate with her estimated Average pre-injury intellectual ability on measures of attentional capacity, speed of processing, learning, memory and executive function. Her ability to learn and retain orally-presented information was a relative strength within the Superior range.   With regards to her emotional functioning, she reported a history of anxiety, depression and moodiness since childhood that  has mildly intensified since her injury. She currently reports moderate levels of anxiety and depression on standardized symptom questionnaires. She did not report problems with behavioral self-control or apathy. Her mother reported that she has not observed any changes in her daughter's mood, social comportment, habits or personality since her injury.  In conclusion, Ms. Womac report of an approximate eight hour period of post-traumatic amnesia would be consistent with a moderate TBI. In light of the severity of her brain injury, she has made an excellent recovery within a relatively short amount of time. Her complaints of reductions in attention, mental speed and memory post-injury are viewed as representing pre-injury weaknesses or problems that have been exacerbated by her TBI. Her reduced verbal retrieval as identified by neurocognitive testing is probably directly secondary to her TBI. Factors that could negatively impact her cognitive functioning include mental/physical fatigability, depression and anxiety.  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 At this point, I am most concerned about her mood, coping ability and psychosocial adjustment, especially as she prepares to return to the demands of college life. She has a past history of emotional difficulties and currently reports high levels of anxiety and depression despite being on summer vacation. Moreover, the nature of her brain injury (i.e., bifrontal contusions) would have the potential to exacerbate her pre-existing attentional difficulties, depression, moodiness, low frustration tolerance and  anxiety. . She was advised to resume services with her psychiatrist at the college counseling center. This report will  be sent to him to aid continuity of care.  Marland Kitchen She would likely benefit from psychological counseling to improve her coping skills and monitor her adjustment.  2. Ms. Kothari reported that using Adderall prior to her TBI helped her greatly to focus and concentrate. I would defer the decision as to whether to resume psychostimulant medication to her physicians.  3. With regards to her imminent return to college, persisting symptoms from her TBI, such as reduced efficiency of verbal retrieval and propensity to fatigue both physically and mentally, place her at risk for decreased academic performance.   . It would be ideal for her to resume school on a reduced course-load, at least for the first semester.  . She should schedule as many classes for the mornings as possible as her mental stamina is likely to diminish as the day progresses. . If possible, she should schedule classes so there are rest breaks in between. . It is recommended that the educational accommodations in place prior to her TBI, specifically studying and taking tests in a distraction-free setting as well as being allowed extra time of tests, be continued. Neuropsychological evaluation suggests that she would best be able to display her learning and knowledge on a multiple choice format versus fill-in or essay exams.  . She was advised to contact the Disabilities Services office at [facility] [employer] soon after her arrival on campus so staff there can assist her as needed. She will be sent a copy of this report to provide to staff there.    4. The importance of adequate sleep for brain healing was emphasized to her.   5. She was advised not to engage in any contact sports that would have the possibility of causing further head trauma. She agreed and stated that the only sport she plays is tennis.  6. The deleterious  effects of alcohol and drug use after TBI were discussed with her. She agreed to abstain from alcohol and drug use.  The results and recommendations from this evaluation were discussed at length with Ms. Mcnear and her mother on 10/16/14.   I have appreciated the opportunity to evaluate Ms. Elige Radon. Please feel free to contact me with any comments or questions.     ______________________ Gladstone Pih, Ph.D Licensed Psychologist        Copies to: Maeola Harman, MD   [facility]     Marlan Palau, MD   [facility]    Ms. Elias Bordner         ADDENDUM-NEUROPSYCHOLOGICAL TEST RESULTS  Animal Naming Test Score= 18 8th (adjusted for age, gender and educational level)    Boston Naming Test Score= 53/60 11th  (adjusted for age, gender and educational level)    [place] Verbal Learning Test-II  Raw score Percentile (adjusted for age)  Trial 1 Free recall 11 98th   Trials 1 - 5 Free recall  72 97th    List B Free Recall   7 50th   Short-Delay Free Recall 15 84th    Short-Delay Cued Recall  14 68th   Long-Delay Free Recall 16 94th   Long-Delay Cued Recall  16 84th    Total intrusions  0 normal  Total repetitions  2 normal  Recognition Hits 16 normal                 Conners' Continuous Performance Test II (selected measures) Measure Guideline  Omissions Within average range  Commissions Mildly Atypical  Hit Reaction Time A little fast  Perseverations Within average range  Hit Reaction Time Block Change Within average range  Hit Standard Error Block Change Within average range    Controlled Oral Word Association Test Score=  57 words/4 repetitions 76th (adjusted for age, gender and educational level)    Paced Auditory Serial Addition Test Series 1: 41   series 2: 33   Series 3: 37   Series 4: 23   Total=    134 46th (adjusted for age, race and educational level)    Stroop Color Word  Test  Score Residual % (adjusted for age and educational level)  Word 113   7 70th   Color  74 - 6 31st    Color-Word  47   0 50th      Trails A Score=  28s  0e 21st  (adjusted for age, gender and educational level)  Trails B Score=  44s  0e 62nd  (adjusted for age, gender and educational level)    Wechsler Adult Intelligence Scale-IV   Subtest Scaled Score Percentile  Similarities 11 63rd    Digit Span  Forward               Backward               Sequencing 13 13 16   9  84th        84th   98th    37th    Matrix Reasoning 11 63rd     Arithmetic 12 75th   Coding   11 63rd       Wechsler Memory Scale-IV Index Index Score Percentile  Immediate Memory 109 73rd    Auditory Memory 126 96th     Visual Memory   93 32nd     Delayed Memory  112 79th    Visual Working Memory  100 50th      [facility]  Total errors=  7   92nd (adjusted for age and education)  Perseverative errors=  4 >99th    Categories=  6 >16th   Trials to first category= 11 >16th   Failure to maintain set   0 >16th   Learning to learn= 0.15 >16th     Wide Range Achievement Test-4 Subtest  Raw score Standard score Percentile  Word Reading 63/70 108 70th

## 2015-02-19 ENCOUNTER — Encounter: Payer: Self-pay | Admitting: Obstetrics and Gynecology

## 2015-02-19 ENCOUNTER — Ambulatory Visit (INDEPENDENT_AMBULATORY_CARE_PROVIDER_SITE_OTHER): Payer: BLUE CROSS/BLUE SHIELD | Admitting: Obstetrics and Gynecology

## 2015-02-19 VITALS — BP 100/56 | HR 84 | Resp 15 | Ht 63.25 in | Wt 124.0 lb

## 2015-02-19 DIAGNOSIS — Z113 Encounter for screening for infections with a predominantly sexual mode of transmission: Secondary | ICD-10-CM | POA: Diagnosis not present

## 2015-02-19 DIAGNOSIS — Z3009 Encounter for other general counseling and advice on contraception: Secondary | ICD-10-CM | POA: Diagnosis not present

## 2015-02-19 DIAGNOSIS — Z8742 Personal history of other diseases of the female genital tract: Secondary | ICD-10-CM

## 2015-02-19 DIAGNOSIS — G43009 Migraine without aura, not intractable, without status migrainosus: Secondary | ICD-10-CM | POA: Insufficient documentation

## 2015-02-19 DIAGNOSIS — N6012 Diffuse cystic mastopathy of left breast: Secondary | ICD-10-CM | POA: Diagnosis not present

## 2015-02-19 DIAGNOSIS — Z01419 Encounter for gynecological examination (general) (routine) without abnormal findings: Secondary | ICD-10-CM

## 2015-02-19 MED ORDER — MISOPROSTOL 200 MCG PO TABS: ORAL_TABLET | ORAL | Status: DC

## 2015-02-19 NOTE — Progress Notes (Signed)
Patient ID: Hannah Reeves, female   DOB: 05/27/1995, 19 y.o.   MRN: [id number] 19 y.o. G0P0000 SingleCaucasianF here for annual exam. She has normal cycles, she saturates a super tampon in 6 hours. Sexually active, no current partners, 3-4 partners in the last years. She would like to discuss getting an IUD. Last year she had a pap, it returned with ASCUS, +HPV, negative for 16 and 18.  In June she fell off a balcony and had a skull fracture and compression fractures of T 12 and L 3. She has residual issues with learning, worsening depression. She see's a Psychiatrist who feels she is on the right medication. She currently declines counseling, hasn't found it helpful in the past (discussed that it could be a matter of finding the right therapist for her).   Period Cycle (Days): 28 Period Duration (Days): 4 days Period Pattern: Regular Menstrual Flow: Moderate Menstrual Control: Tampon Dysmenorrhea: None  Patient's last menstrual period was 01/31/2015.          Sexually active: Yes.    The current method of family planning is condoms all the time.    Exercising: No.  The patient does not participate in regular exercise at present. Smoker:  no  Health Maintenance: Pap:  06-28-13 ASCUS POSITIVE HR HPV History of abnormal Pap:  yes TDaP:  2014 Gardasil: completed all 3    reports that she has never smoked. She has never used smokeless tobacco. She reports that she drinks about 1.8 oz of alcohol per week. She reports that she uses illicit drugs (Marijuana). She is a Holiday representative at [employer], Glass blower/designer in [employer].   Past Medical History  Diagnosis Date  . Migraines   . IBS (irritable bowel syndrome)   . H/O cold sores   . History of spinal fracture     T12 compression fracture  . Abnormal Pap smear of cervix 2015    ASCUS/ High Risk HPV detected  . Anxiety   . Depression     Past Surgical History  Procedure Laterality Date  . No past surgeries      Current Outpatient  Prescriptions  Medication Sig Dispense Refill  . amphetamine-dextroamphetamine (ADDERALL XR) 20 MG 24 hr capsule Take 20 mg by mouth daily.  0  . amphetamine-dextroamphetamine (ADDERALL) 20 MG tablet TAKE 1 TABLET BY MOUTH ONCE A DAY AT 11 AM  0  . buPROPion (WELLBUTRIN XL) 150 MG 24 hr tablet TAKE 1 TABLET BY MOUTH ONCE EVERY MORNING 90 tablet 0  . escitalopram (LEXAPRO) 10 MG tablet Take 10 mg by mouth daily.    . valACYclovir (VALTREX) 1000 MG tablet      No current facility-administered medications for this visit.    Family History  Problem Relation Age of Onset  . Allergies Mother   . Stroke Paternal Grandfather     Review of Systems  Constitutional: Negative.   HENT: Negative.   Eyes: Negative.   Respiratory: Negative.   Cardiovascular: Negative.   Gastrointestinal: Negative.   Endocrine: Negative.   Genitourinary: Negative.   Musculoskeletal: Negative.   Skin: Negative.   Allergic/Immunologic: Negative.   Neurological: Negative.   Psychiatric/Behavioral: Negative.     Exam:   BP 100/56 mmHg  Pulse 84  Resp 15  Ht 5' 3.25" (1.607 m)  Wt 124 lb (56.246 kg)  BMI 21.78 kg/m2  LMP 01/31/2015  Weight change: @ Height:   Height: 5' 3.25" (160.7 cm)  Ht Readings from Last 3 Encounters:  02/19/15  5' 3.25" (1.607 m)  08/09/14 5\' 3"  (1.6 m)  02/15/14 5' 3.5" (1.613 m) (38 %*, Z = -0.31)   * Growth percentiles are based on CDC 2-20 Years data.    General appearance: alert, cooperative and appears stated age Head: Normocephalic, without obvious abnormality, atraumatic Neck: no adenopathy, supple, symmetrical, trachea midline and thyroid normal to inspection and palpation Lungs: clear to auscultation bilaterally Breasts: In the left breast at 3 o'clock there is a bean shaped area of increased nodularity, 2.5 cm in length. No right breast lumps. No skin dimpling or nipple discharge. Heart: regular rate and rhythm Abdomen: soft, non-tender; bowel sounds  normal; no masses,  no organomegaly Extremities: extremities normal, atraumatic, no cyanosis or edema Skin: Skin color, texture, turgor normal. No rashes or lesions Lymph nodes: Cervical, supraclavicular, and axillary nodes normal. No abnormal inguinal nodes palpated Neurologic: Grossly normal   Pelvic: External genitalia:  no lesions              Urethra:  normal appearing urethra with no masses, tenderness or lesions              Bartholins and Skenes: normal                 Vagina: normal appearing vagina with normal color and discharge, no lesions              Cervix: no lesions               Bimanual Exam:  Uterus:  normal size, contour, position, consistency, mobility, non-tender and anteverted              Adnexa: no mass, fullness, tenderness               Chaperone was present for exam.  A:  GYN exam  Left breast nodularity, suspect fibrocystic changes  STD screening  H/O ASCUS, +HPV (-16/18) last year  Contraception options reviewed. The patient is interested in the paragard IUD, risks and side effects reviewed. Information given   P:   Pap  STD testing  Return on menses for Paragard IUD insertion, will pre-treat with cytotec  Encouraged continued use of condoms  Discussed breast self exam, showed patient the area of nodularity  She will try and cut back on caffeine use, will repeat the breast exam at her IUD insertion. If abnormality persists will set up an ultrasound of the breast

## 2015-02-19 NOTE — Patient Instructions (Signed)
Fibrocystic Breast Changes Fibrocystic breast changes occur when breast ducts become blocked, causing painful, fluid-filled lumps (cysts) to form in the breast. This is a common condition that is noncancerous (benign). It occurs when women go through hormonal changes during their menstrual cycle. Fibrocystic breast changes can affect one or both breasts. CAUSES  The exact cause of fibrocystic breast changes is not known, but it may be related to the female hormones estrogen and progesterone. Family traits that get passed from parent to child (genetics) may also be a factor in some cases. SIGNS AND SYMPTOMS   Tenderness, mild discomfort, or pain.   Swelling.   Rope-like feeling when touching the breast.   Lumpy breast, one or both sides.   Changes in breast size, especially before (larger) and after (smaller) the menstrual period.   Green or dark brown nipple discharge (not blood).  Symptoms are usually worse before menstrual periods start and get better toward the end of the menstrual period.  DIAGNOSIS  To make a diagnosis, your health care provider will ask you questions and perform a physical exam of your breasts. The health care provider may recommend other tests that can examine inside your breasts, such as:  A breast X-ray (mammogram).   Ultrasonography.  An MRI.  If something more than fibrocystic breast changes is suspected, your health care provider may take a breast tissue sample (breast biopsy) to examine. TREATMENT  Often, treatment is not needed. Your health care provider may recommend over-the-counter pain relievers to help lessen pain or discomfort caused by the fibrocystic breast changes. You may also be asked to change your diet to limit or stop eating foods or drinking beverages that contain caffeine. Foods and beverages that contain caffeine include chocolate, soda, coffee, and tea. Reducing sugar and fat in your diet may also help. Your health care provider  may also recommend:  Fine needle aspiration to remove fluid from a cyst that is causing pain.   Surgery to remove a large, persistent, and tender cyst. HOME CARE INSTRUCTIONS   Examine your breasts after every menstrual period. If you do not have menstrual periods, check your breasts the first day of every month. Feel for changes, such as more tenderness, a new growth, a change in breast size, or a change in a lump that has always been there.   Only take over-the-counter or prescription medicine as directed by your health care provider.   Wear a well-fitted support or sports bra, especially when exercising.   Decrease or avoid caffeine, fat, and sugar in your diet as directed by your health care provider.  SEEK MEDICAL CARE IF:   You have fluid leaking (discharge) from your nipples, especially bloody discharge.   You have new lumps or bumps in the breast.   Your breast or breasts become enlarged, red, and painful.   You have areas of your breast that pucker in.   Your nipples appear flat or indented.    This information is not intended to replace advice given to you by your health care provider. Make sure you discuss any questions you have with your health care provider.   Document Released: 12/03/2005 Document Revised: 11/07/2014 Document Reviewed: 08/07/2012 Elsevier Interactive Patient Education 2016 [employer]. Intrauterine Device Insertion Most often, an intrauterine device (IUD) is inserted into the uterus to prevent pregnancy. There are 2 types of IUDs available:  Copper IUD--This type of IUD creates an environment that is not favorable to sperm survival. The mechanism of action of the  copper IUD is not known for certain. It can stay in place for 10 years.  Hormone IUD--This type of IUD contains the hormone progestin (synthetic progesterone). The progestin thickens the cervical mucus and prevents sperm from entering the uterus, and it also thins the uterine  lining. There is no evidence that the hormone IUD prevents implantation. One hormone IUD can stay in place for up to 5 years, and a different hormone IUD can stay in place for up to 3 years. An IUD is the most cost-effective birth control if left in place for the full duration. It may be removed at any time. LET [facility] CARE PROVIDER KNOW ABOUT:  Any allergies you have.  All medicines you are taking, including vitamins, herbs, eye drops, creams, and over-the-counter medicines.  Previous problems you or members of your family have had with the use of anesthetics.  Any blood disorders you have.  Previous surgeries you have had.  Possibility of pregnancy.  Medical conditions you have. RISKS AND COMPLICATIONS  Generally, intrauterine device insertion is a safe procedure. However, as with any procedure, complications can occur. Possible complications include:  Accidental puncture (perforation) of the uterus.  Accidental placement of the IUD either in the muscle layer of the uterus (myometrium) or outside the uterus. If this happens, the IUD can be found essentially floating around the bowels and must be taken out surgically.  The IUD may fall out of the uterus (expulsion). This is more common in women who have recently had a child.   Pregnancy in the fallopian tube (ectopic).  Pelvic inflammatory disease (PID), which is infection of the uterus and fallopian tubes. The risk of PID is slightly increased in the first 20 days after the IUD is placed, but the overall risk is still very low. BEFORE THE PROCEDURE  Schedule the IUD insertion for when you will have your menstrual period or right after, to make sure you are not pregnant. Placement of the IUD is better tolerated shortly after a menstrual cycle.  You may need to take tests or be examined to make sure you are not pregnant.  You may be required to take a pregnancy test.  You may be required to get checked for sexually  transmitted infections (STIs) prior to placement. Placing an IUD in someone who has an infection can make the infection worse.  You may be given a pain reliever to take 1 or 2 hours before the procedure.  An exam will be performed to determine the size and position of your uterus.  Ask your health care provider about changing or stopping your regular medicines. PROCEDURE   A tool (speculum) is placed in the vagina. This allows your health care provider to see the lower part of the uterus (cervix).  The cervix is prepped with a medicine that lowers the risk of infection.  You may be given a medicine to numb each side of the cervix (intracervical or paracervical block). This is used to block and control any discomfort with insertion.  A tool (uterine sound) is inserted into the uterus to determine the length of the uterine cavity and the direction the uterus may be tilted.  A slim instrument (IUD inserter) is inserted through the cervical canal and into your uterus.  The IUD is placed in the uterine cavity and the insertion device is removed.  The nylon string that is attached to the IUD and used for eventual IUD removal is trimmed. It is trimmed so that it lays  high in the vagina, just outside the cervix. AFTER THE PROCEDURE  You may have bleeding after the procedure. This is normal. It varies from light spotting for a few days to menstrual-like bleeding.  You may have mild cramping.   This information is not intended to replace advice given to you by your health care provider. Make sure you discuss any questions you have with your health care provider.   Document Released: 10/15/2010 Document Revised: 12/07/2012 Document Reviewed: 08/07/2012 Elsevier Interactive Patient Education [employer] Elsevier Inc.

## 2015-02-20 LAB — HEPATITIS C ANTIBODY: HCV AB: NEGATIVE

## 2015-02-20 LAB — STD PANEL
HIV 1&2 Ab, 4th Generation: NONREACTIVE
Hepatitis B Surface Ag: NEGATIVE

## 2015-02-21 LAB — IPS N GONORRHOEA AND CHLAMYDIA BY PCR

## 2015-02-21 LAB — IPS PAP SMEAR ONLY

## 2015-02-25 ENCOUNTER — Other Ambulatory Visit: Payer: Self-pay | Admitting: Physician Assistant

## 2015-02-26 ENCOUNTER — Telehealth: Payer: Self-pay | Admitting: Obstetrics and Gynecology

## 2015-02-26 DIAGNOSIS — Z30430 Encounter for insertion of intrauterine contraceptive device: Secondary | ICD-10-CM

## 2015-02-26 NOTE — Telephone Encounter (Signed)
Patient calling to schedule appointment for an iud procedure.

## 2015-02-26 NOTE — Telephone Encounter (Signed)
Started cycle 12/26 and is ready to schedule Paragard insertion.  Has Cytotec and understands instructions.   Pre-procedure instructions given. Motrin 800 mg PO one hour before appointment with food and water.  Patient has checked for her coverage of IUD.   Scheduled for 02/27/15 at 1030 with Dr. Oscar LaJertson.  Routing to provider for final review. Patient agreeable to disposition. Will close encounter.  Cc Harland DingwallSuzy Dixon

## 2015-02-27 ENCOUNTER — Ambulatory Visit (INDEPENDENT_AMBULATORY_CARE_PROVIDER_SITE_OTHER): Payer: BLUE CROSS/BLUE SHIELD | Admitting: Obstetrics and Gynecology

## 2015-02-27 ENCOUNTER — Encounter: Payer: Self-pay | Admitting: Obstetrics and Gynecology

## 2015-02-27 VITALS — BP 110/60 | HR 110 | Resp 16 | Wt 126.0 lb

## 2015-02-27 DIAGNOSIS — N6011 Diffuse cystic mastopathy of right breast: Secondary | ICD-10-CM | POA: Diagnosis not present

## 2015-02-27 DIAGNOSIS — N6012 Diffuse cystic mastopathy of left breast: Secondary | ICD-10-CM | POA: Diagnosis not present

## 2015-02-27 DIAGNOSIS — Z30430 Encounter for insertion of intrauterine contraceptive device: Secondary | ICD-10-CM

## 2015-02-27 DIAGNOSIS — Z01812 Encounter for preprocedural laboratory examination: Secondary | ICD-10-CM | POA: Diagnosis not present

## 2015-02-27 LAB — POCT URINE PREGNANCY: PREG TEST UR: NEGATIVE

## 2015-02-27 NOTE — Patient Instructions (Signed)
IUD Post-procedure Instructions . Cramping is common.  You may take Ibuprofen, Aleve, or Tylenol for the cramping.  This should resolve within 24 hours.   . You may have a small amount of spotting.  You should wear a mini pad for the next few days. . You may have intercourse in 24 hours. . You need to call the office if you have any pelvic pain, fever, heavy bleeding, or foul smelling vaginal discharge. . Shower or bathe as normal    Fibrocystic Breast Changes Fibrocystic breast changes occur when breast ducts become blocked, causing painful, fluid-filled lumps (cysts) to form in the breast. This is a common condition that is noncancerous (benign). It occurs when women go through hormonal changes during their menstrual cycle. Fibrocystic breast changes can affect one or both breasts. CAUSES  The exact cause of fibrocystic breast changes is not known, but it may be related to the female hormones estrogen and progesterone. Family traits that get passed from parent to child (genetics) may also be a factor in some cases. SIGNS AND SYMPTOMS   Tenderness, mild discomfort, or pain.   Swelling.   Rope-like feeling when touching the breast.   Lumpy breast, one or both sides.   Changes in breast size, especially before (larger) and after (smaller) the menstrual period.   Green or dark brown nipple discharge (not blood).  Symptoms are usually worse before menstrual periods start and get better toward the end of the menstrual period.  DIAGNOSIS  To make a diagnosis, your health care provider will ask you questions and perform a physical exam of your breasts. The health care provider may recommend other tests that can examine inside your breasts, such as:  A breast X-ray (mammogram).   Ultrasonography.  An MRI.  If something more than fibrocystic breast changes is suspected, your health care provider may take a breast tissue sample (breast biopsy) to examine. TREATMENT  Often,  treatment is not needed. Your health care provider may recommend over-the-counter pain relievers to help lessen pain or discomfort caused by the fibrocystic breast changes. You may also be asked to change your diet to limit or stop eating foods or drinking beverages that contain caffeine. Foods and beverages that contain caffeine include chocolate, soda, coffee, and tea. Reducing sugar and fat in your diet may also help. Your health care provider may also recommend:  Fine needle aspiration to remove fluid from a cyst that is causing pain.   Surgery to remove a large, persistent, and tender cyst. HOME CARE INSTRUCTIONS   Examine your breasts after every menstrual period. If you do not have menstrual periods, check your breasts the first day of every month. Feel for changes, such as more tenderness, a new growth, a change in breast size, or a change in a lump that has always been there.   Only take over-the-counter or prescription medicine as directed by your health care provider.   Wear a well-fitted support or sports bra, especially when exercising.   Decrease or avoid caffeine, fat, and sugar in your diet as directed by your health care provider.  SEEK MEDICAL CARE IF:   You have fluid leaking (discharge) from your nipples, especially bloody discharge.   You have new lumps or bumps in the breast.   Your breast or breasts become enlarged, red, and painful.   You have areas of your breast that pucker in.   Your nipples appear flat or indented.    This information is not intended to replace  advice given to you by your health care provider. Make sure you discuss any questions you have with your health care provider.   Document Released: 12/03/2005 Document Revised: 11/07/2014 Document Reviewed: 08/07/2012 Elsevier Interactive Patient Education [employer]

## 2015-02-27 NOTE — Progress Notes (Signed)
Patient ID: Hannah Reeves, female   DOB: 10/15/1995, 19 y.o.   MRN: [id number] GYNECOLOGY  VISIT   HPI: 19 y.o.   Single  Caucasian  female   G0P0000 with Patient's last menstrual period was 02/24/2015.   here for to have the Paragard IUD inserted and for a f/u breast check. She was noted to have fibrocystic breasts at her annual exam, prominent nodularity in the left breast. She has decreased her caffeine intake. She had negative GC/CT testing at her annual exam. She is on her cycle and took cytotec in preporation for IUD insertion  GYNECOLOGIC HISTORY: Patient's last menstrual period was 02/24/2015. Contraception:None Menopausal hormone therapy: None        OB History    Gravida Para Term Preterm AB TAB SAB Ectopic Multiple Living   0 0 0 0 0 0 0 0 0 0          Patient Active Problem List   Diagnosis Date Noted  . Migraine without aura   . ADD (attention deficit disorder) 11/07/2013  . Anxiety state 11/07/2013  . Depression 11/07/2013  . Asthma   . Migraines   . IBS (irritable bowel syndrome)   . H/O cold sores     Past Medical History  Diagnosis Date  . Migraine without aura   . IBS (irritable bowel syndrome)   . H/O cold sores   . History of spinal fracture     T12 compression fracture  . Abnormal Pap smear of cervix 2015    ASCUS/ High Risk HPV detected  . Anxiety   . Depression   . Skull fracture with cerebral contusion [facility](HCC)     Past Surgical History  Procedure Laterality Date  . No past surgeries      Current Outpatient Prescriptions  Medication Sig Dispense Refill  . acyclovir (ZOVIRAX) 400 MG tablet Take 1 tablet  2 x day  - Need office Visit before further refills 60 tablet 0  . amphetamine-dextroamphetamine (ADDERALL XR) 20 MG 24 hr capsule Take 20 mg by mouth daily.  0  . amphetamine-dextroamphetamine (ADDERALL) 20 MG tablet TAKE 1 TABLET BY MOUTH ONCE A DAY AT 11 AM  0  . buPROPion (WELLBUTRIN XL) 150 MG 24 hr tablet TAKE 1 TABLET BY MOUTH ONCE  EVERY MORNING 90 tablet 0  . escitalopram (LEXAPRO) 10 MG tablet Take 10 mg by mouth daily.    . misoprostol (CYTOTEC) 200 MCG tablet Insert 2 tabs per vagina 6-12 hours prior to IUD insertion 2 tablet 0  . valACYclovir (VALTREX) 1000 MG tablet      No current facility-administered medications for this visit.     ALLERGIES: Peanut-containing drug products; Shellfish allergy; Penicillins; and Sulfa antibiotics  Family History  Problem Relation Age of Onset  . Allergies Mother   . Stroke Paternal Grandfather     Social History   Social History  . Marital Status: Single    Spouse Name: N/A  . Number of Children: N/A  . Years of Education: N/A   Occupational History  . Not on file.   Social History Main Topics  . Smoking status: Never Smoker   . Smokeless tobacco: Never Used  . Alcohol Use: 1.8 oz/week    3 Standard drinks or equivalent per week  . Drug Use: Yes    Special: Marijuana  . Sexual Activity:    Partners: Male   Other Topics Concern  . Not on file   Social History Narrative    Review  of Systems  All other systems reviewed and are negative.   PHYSICAL EXAMINATION:    BP 110/60 mmHg  Pulse 110  Resp 16  Wt 126 lb (57.153 kg)  LMP 02/24/2015    General appearance: alert, cooperative and appears stated age Breasts: bilateral fibrocystic changes, symmetrical on exam today. No distinct lumps.  Lymph: no axillary nodularity  Pelvic: External genitalia:  no lesions              Urethra:  normal appearing urethra with no masses, tenderness or lesions              Bartholins and Skenes: normal                 Vagina: normal appearing vagina with normal color and discharge, no lesions              Cervix: no lesions   The risks of the Paragard IUD were reviewed with the patient, including infection, abnormal bleeding and uterine perfortion. Consent was signed.  A speculum was placed in the vagina, the cervix was cleansed with betadine. A tenaculum was  placed on the cervix, the uterus sounded to 6.5 cm.  The Paragard IUD was inserted without difficulty. The string were cut to 3-4 cm. The tenaculum was removed. Slight oozing from the tenaculum site was stopped with pressure.   The patient tolerated the procedure well.                 Chaperone was present for exam.  ASSESSMENT IUD insertion, Paragard. Negative UPT, negative genprobe Fibrocystic breasts, exam improved    PLAN F/U in 1 month Continue to use condoms Watch caffeine intake Call with any concerns   An After Visit Summary was printed and given to the patient.

## 2015-02-28 ENCOUNTER — Telehealth: Payer: Self-pay

## 2015-02-28 NOTE — Telephone Encounter (Signed)
Spoke with patient. Results given as seen below from Dr.Jertson. Patient is agreeable and verbalizes understanding. 08 recall placed.  Routing to provider for final review. Patient agreeable to disposition. Will close encounter.  

## 2015-02-28 NOTE — Telephone Encounter (Signed)
-----   Message from Romualdo BolkJill Evelyn Jertson, MD sent at 02/27/2015  3:07 PM EST ----- She needs a f/u pap next year, please inform ASCUS, no hpv testing done

## 2015-02-28 NOTE — Telephone Encounter (Signed)
Left message to call Carolos Fecher at [phone]. 

## 2015-07-04 ENCOUNTER — Encounter: Payer: Self-pay | Admitting: Physician Assistant

## 2015-09-07 ENCOUNTER — Other Ambulatory Visit: Payer: Self-pay | Admitting: Internal Medicine

## 2016-03-03 ENCOUNTER — Telehealth: Payer: Self-pay | Admitting: *Deleted

## 2016-03-03 NOTE — Telephone Encounter (Signed)
Spoke with patient and tried to schedule for AEX/PAP. Patient states she is driving and will call back to schedule when she has her calendar.  Will call back and follow up with patient if appointment is not made this week

## 2016-03-11 NOTE — Telephone Encounter (Signed)
Called patient again and tried to schedule. She does not have her calendar with her. She is away at school. Patient states she will call back not week and schedule for a Monday morning. -eh

## 2016-03-17 NOTE — Telephone Encounter (Signed)
Spoke with patient and scheduled with DL for [phone] since she could only do a Friday evening.

## 2016-03-27 ENCOUNTER — Ambulatory Visit (INDEPENDENT_AMBULATORY_CARE_PROVIDER_SITE_OTHER): Payer: Managed Care, Other (non HMO) | Admitting: Certified Nurse Midwife

## 2016-03-27 ENCOUNTER — Encounter: Payer: Self-pay | Admitting: Certified Nurse Midwife

## 2016-03-27 VITALS — BP 90/60 | HR 64 | Resp 16 | Wt 129.0 lb

## 2016-03-27 DIAGNOSIS — Z124 Encounter for screening for malignant neoplasm of cervix: Secondary | ICD-10-CM

## 2016-03-27 DIAGNOSIS — Z0000 Encounter for general adult medical examination without abnormal findings: Secondary | ICD-10-CM

## 2016-03-27 DIAGNOSIS — Z01419 Encounter for gynecological examination (general) (routine) without abnormal findings: Secondary | ICD-10-CM | POA: Diagnosis not present

## 2016-03-27 LAB — POCT URINALYSIS DIPSTICK
Bilirubin, UA: NEGATIVE
Blood, UA: NEGATIVE
GLUCOSE UA: NEGATIVE
Ketones, UA: NEGATIVE
LEUKOCYTES UA: NEGATIVE
NITRITE UA: NEGATIVE
Protein, UA: NEGATIVE
UROBILINOGEN UA: NEGATIVE
pH, UA: 5

## 2016-03-27 NOTE — Patient Instructions (Signed)
General topics  Next pap or exam is  due in 1 year Take a Women's multivitamin Take 1200 mg. of calcium daily - prefer dietary If any concerns in interim to call back  Breast Self-Awareness Practicing breast self-awareness may pick up problems early, prevent significant medical complications, and possibly save your life. By practicing breast self-awareness, you can become familiar with how your breasts look and feel and if your breasts are changing. This allows you to notice changes early. It can also offer you some reassurance that your breast health is good. One way to learn what is normal for your breasts and whether your breasts are changing is to do a breast self-exam. If you find a lump or something that was not present in the past, it is best to contact your caregiver right away. Other findings that should be evaluated by your caregiver include nipple discharge, especially if it is bloody; skin changes or reddening; areas where the skin seems to be pulled in (retracted); or new lumps and bumps. Breast pain is seldom associated with cancer (malignancy), but should also be evaluated by a caregiver. BREAST SELF-EXAM The best time to examine your breasts is 5 7 days after your menstrual period is over.  ExitCare Patient Information 2013 ExitCare[employer] LLC.   Exercise to Stay Healthy Exercise helps you become and stay healthy. EXERCISE IDEAS AND TIPS Choose exercises that:  You enjoy.  Fit into your day. You do not need to exercise really hard to be healthy. You can do exercises at a slow or medium level and stay healthy. You can:  Stretch before and after working out.  Try yoga, Pilates, or tai chi.  Lift weights.  Walk fast, swim, jog, run, climb stairs, bicycle, dance, or rollerskate.  Take aerobic classes. Exercises that burn about 150 calories:  Running 1  miles in 15 minutes.  Playing volleyball for 45 to 60 minutes.  Washing and waxing a car for 45 to 60  minutes.  Playing touch football for 45 minutes.  Walking 1  miles in 35 minutes.  Pushing a stroller 1  miles in 30 minutes.  Playing basketball for 30 minutes.  Raking leaves for 30 minutes.  Bicycling 5 miles in 30 minutes.  Walking 2 miles in 30 minutes.  Dancing for 30 minutes.  Shoveling snow for 15 minutes.  Swimming laps for 20 minutes.  Walking up stairs for 15 minutes.  Bicycling 4 miles in 15 minutes.  Gardening for 30 to 45 minutes.  Jumping rope for 15 minutes.  Washing windows or floors for 45 to 60 minutes. Document Released: 03/21/2010 Document Revised: 05/11/2011 Document Reviewed: 03/21/2010 ExitCare Patient Information 2013 ExitCare[employer].   Other topics ( that may be useful information):    Sexually Transmitted Disease Sexually transmitted disease (STD) refers to any infection that is passed from person to person during sexual activity. This may happen by way of saliva, semen, blood, vaginal mucus, or urine. Common STDs include:  Gonorrhea.  Chlamydia.  Syphilis.  HIV/AIDS.  Genital herpes.  Hepatitis B and C.  Trichomonas.  Human papillomavirus (HPV).  Pubic lice. CAUSES  An STD may be spread by bacteria, virus, or parasite. A person can get an STD by:  Sexual intercourse with an infected person.  Sharing sex toys with an infected person.  Sharing needles with an infected person.  Having intimate contact with the genitals, mouth, or rectal areas of an infected person. SYMPTOMS  Some people may not have any symptoms, but   they can still pass the infection to others. Different STDs have different symptoms. Symptoms include:  Painful or bloody urination.  Pain in the pelvis, abdomen, vagina, anus, throat, or eyes.  Skin rash, itching, irritation, growths, or sores (lesions). These usually occur in the genital or anal area.  Abnormal vaginal discharge.  Penile discharge in men.  Soft, flesh-colored skin growths in the  genital or anal area.  Fever.  Pain or bleeding during sexual intercourse.  Swollen glands in the groin area.  Yellow skin and eyes (jaundice). This is seen with hepatitis. DIAGNOSIS  To make a diagnosis, your caregiver may:  Take a medical history.  Perform a physical exam.  Take a specimen (culture) to be examined.  Examine a sample of discharge under a microscope.  Perform blood test TREATMENT   Chlamydia, gonorrhea, trichomonas, and syphilis can be cured with antibiotic medicine.  Genital herpes, hepatitis, and HIV can be treated, but not cured, with prescribed medicines. The medicines will lessen the symptoms.  Genital warts from HPV can be treated with medicine or by freezing, burning (electrocautery), or surgery. Warts may come back.  HPV is a virus and cannot be cured with medicine or surgery.However, abnormal areas may be followed very closely by your caregiver and may be removed from the cervix, vagina, or vulva through office procedures or surgery. If your diagnosis is confirmed, your recent sexual partners need treatment. This is true even if they are symptom-free or have a negative culture or evaluation. They should not have sex until their caregiver says it is okay. HOME CARE INSTRUCTIONS  All sexual partners should be informed, tested, and treated for all STDs.  Take your antibiotics as directed. Finish them even if you start to feel better.  Only take over-the-counter or prescription medicines for pain, discomfort, or fever as directed by your caregiver.  Rest.  Eat a balanced diet and drink enough fluids to keep your urine clear or pale yellow.  Do not have sex until treatment is completed and you have followed up with your caregiver. STDs should be checked after treatment.  Keep all follow-up appointments, Pap tests, and blood tests as directed by your caregiver.  Only use latex condoms and water-soluble lubricants during sexual activity. Do not use  petroleum jelly or oils.  Avoid alcohol and illegal drugs.  Get vaccinated for HPV and hepatitis. If you have not received these vaccines in the past, talk to your caregiver about whether one or both might be right for you.  Avoid risky sex practices that can break the skin. The only way to avoid getting an STD is to avoid all sexual activity.Latex condoms and dental dams (for oral sex) will help lessen the risk of getting an STD, but will not completely eliminate the risk. SEEK MEDICAL CARE IF:   You have a fever.  You have any new or worsening symptoms. Document Released: 05/09/2002 Document Revised: 05/11/2011 Document Reviewed: 05/16/2010 [facility] Patient Information 2013 [place].    Domestic Abuse You are being battered or abused if someone close to you hits, pushes, or physically hurts you in any way. You also are being abused if you are forced into activities. You are being sexually abused if you are forced to have sexual contact of any kind. You are being emotionally abused if you are made to feel worthless or if you are constantly threatened. It is important to remember that help is available. No one has the right to abuse you. PREVENTION OF FURTHER  ABUSE  Learn the warning signs of danger. This varies with situations but may include: the use of alcohol, threats, isolation from friends and family, or forced sexual contact. Leave if you feel that violence is going to occur.  If you are attacked or beaten, report it to the police so the abuse is documented. You do not have to press charges. The police can protect you while you or the attackers are leaving. Get the officer's name and badge number and a copy of the report.  Find someone you can trust and tell them what is happening to you: your caregiver, a nurse, clergy member, close friend or family member. Feeling ashamed is natural, but remember that you have done nothing wrong. No one deserves abuse. Document Released:  02/14/2000 Document Revised: 05/11/2011 Document Reviewed: 04/24/2010 ExitCare Patient Information 2013 [employer]Care[employer].    How Much is Too Much Alcohol? Drinking too much alcohol can cause injury, accidents, and health problems. These types of problems can include:   Car crashes.  Falls.  Family fighting (domestic violence).  Drowning.  Fights.  Injuries.  Burns.  Damage to certain organs.  Having a baby with birth defects. ONE DRINK CAN BE TOO MUCH WHEN YOU ARE:  Working.  Pregnant or breastfeeding.  Taking medicines. Ask your doctor.  Driving or planning to drive. If you or someone you know has a drinking problem, get help from a doctor.  Document Released: 12/13/2008 Document Revised: 05/11/2011 Document Reviewed: 12/13/2008 ExitCare Patient Information 2013 ExitCare[employer].   Smoking Hazards Smoking cigarettes is extremely bad for your health. Tobacco smoke has over 200 known poisons in it. There are over 60 chemicals in tobacco smoke that cause cancer. Some of the chemicals found in cigarette smoke include:   Cyanide.  Benzene.  Formaldehyde.  Methanol (wood alcohol).  Acetylene (fuel used in welding torches).  Ammonia. Cigarette smoke also contains the poisonous gases nitrogen oxide and carbon monoxide.  Cigarette smokers have an increased risk of many serious medical problems and Smoking causes approximately:  90% of all lung cancer deaths in men.  80% of all lung cancer deaths in women.  90% of deaths from chronic obstructive lung disease. Compared with nonsmokers, smoking increases the risk of:  Coronary heart disease by 2 to 4 times.  Stroke by 2 to 4 times.  Men developing lung cancer by 23 times.  Women developing lung cancer by 13 times.  Dying from chronic obstructive lung diseases by 12 times.  . Smoking is the most preventable cause of death and disease in our society.  WHY IS SMOKING ADDICTIVE?  Nicotine is the chemical  agent in tobacco that is capable of causing addiction or dependence.  When you smoke and inhale, nicotine is absorbed rapidly into the bloodstream through your lungs. Nicotine absorbed through the lungs is capable of creating a powerful addiction. Both inhaled and non-inhaled nicotine may be addictive.  Addiction studies of cigarettes and spit tobacco show that addiction to nicotine occurs mainly during the teen years, when young people begin using tobacco products. WHAT ARE THE BENEFITS OF QUITTING?  There are many health benefits to quitting smoking.   Likelihood of developing cancer and heart disease decreases. Health improvements are seen almost immediately.  Blood pressure, pulse rate, and breathing patterns start returning to normal soon after quitting. QUITTING SMOKING   American Lung Association - 1-800-LUNGUSA  American Cancer Society - 1-800-ACS-2345 Document Released: 03/26/2004 Document Revised: 05/11/2011 Document Reviewed: 11/28/2008 ExitCare Patient Information 2013 ExitCare,   LLC.   Stress Management Stress is a state of physical or mental tension that often results from changes in your life or normal routine. Some common causes of stress are:  Death of a loved one.  Injuries or severe illnesses.  Getting fired or changing jobs.  Moving into a new home. Other causes may be:  Sexual problems.  Business or financial losses.  Taking on a large debt.  Regular conflict with someone at home or at work.  Constant tiredness from lack of sleep. It is not just bad things that are stressful. It may be stressful to:  Win the lottery.  Get married.  Buy a new car. The amount of stress that can be easily tolerated varies from person to person. Changes generally cause stress, regardless of the types of change. Too much stress can affect your health. It may lead to physical or emotional problems. Too little stress (boredom) may also become stressful. SUGGESTIONS TO  REDUCE STRESS:  Talk things over with your family and friends. It often is helpful to share your concerns and worries. If you feel your problem is serious, you may want to get help from a professional counselor.  Consider your problems one at a time instead of lumping them all together. Trying to take care of everything at once may seem impossible. List all the things you need to do and then start with the most important one. Set a goal to accomplish 2 or 3 things each day. If you expect to do too many in a single day you will naturally fail, causing you to feel even more stressed.  Do not use alcohol or drugs to relieve stress. Although you may feel better for a short time, they do not remove the problems that caused the stress. They can also be habit forming.  Exercise regularly - at least 3 times per week. Physical exercise can help to relieve that "uptight" feeling and will relax you.  The shortest distance between despair and hope is often a good night's sleep.  Go to bed and get up on time allowing yourself time for appointments without being rushed.  Take a short "time-out" period from any stressful situation that occurs during the day. Close your eyes and take some deep breaths. Starting with the muscles in your face, tense them, hold it for a few seconds, then relax. Repeat this with the muscles in your neck, shoulders, hand, stomach, back and legs.  Take good care of yourself. Eat a balanced diet and get plenty of rest.  Schedule time for having fun. Take a break from your daily routine to relax. HOME CARE INSTRUCTIONS   Call if you feel overwhelmed by your problems and feel you can no longer manage them on your own.  Return immediately if you feel like hurting yourself or someone else. Document Released: 08/12/2000 Document Revised: 05/11/2011 Document Reviewed: 04/04/2007 [facility] Patient Information 2013 [place].   Intrauterine Device Information Introduction An  intrauterine device (IUD) is a medical device that gets inserted into the uterus to prevent pregnancy. It is a small, T-shaped device that has one or two nylon strings hanging down from it. The strings hang out of the lower part of the uterus (cervix) to allow for future IUD removal. There are two types of IUDs available:  Copper IUD. This type of IUD has copper wire wrapped around it. A copper IUD may last up to 10 years.  Hormone IUD. This type of IUD is made of plastic  and contains the hormone progestin (synthetic progesterone). A hormone IUD may last 3 to 5 years. IUDs are inserted through the vagina and placed into the uterus with a minor medical procedure. How does the IUD work? Copper in the copper IUD prevents pregnancy by making the uterus and fallopian tubes produce a fluid that kills sperm. Synthetic progesterone in hormonal IUD prevents pregnancy by:  Thickening cervical mucus to prevent sperm from entering the uterus.  Thinning the uterine lining to prevent implantation of a fertilized egg.  Weakening or killing sperm that get into the uterus. What are the advantages of an IUD?  IUDs are highly effective, reversible, long-acting, and low-maintenance.  There are no estrogen-related side effects.  An IUD can be used when breastfeeding.  IUDs are not associated with weight gain.  Advantages of the copper IUD are that:  It works immediately after insertion.  It does not interfere with your body's natural hormones.  It can be used for 10 years.  Advantages of the hormone IUD are that:  If it is inserted within 7 days of your period starting, it works immediately after insertion. If the hormone IUD is inserted at any other time in your cycle, you will need to use a backup method of birth control for 7 days after insertion.  It can make menstrual periods lighter.  It can decrease menstrual cramping.  It can be used for 3 or 5 years. What are the disadvantages of an  IUD?  The hormone IUD may cause irregular menstrual bleeding for a period of time after insertion.  The copper IUD can make your menstrual flow heavier and more painful.  You may experience some pain during insertion, and cramping and vaginal bleeding after insertion. How is the IUD removed? Is the IUD right for me?  Your health care provider will make sure you are a good candidate for an IUD and will discuss side effects with you. This information is not intended to replace advice given to you by your health care provider. Make sure you discuss any questions you have with your health care provider. Document Released: 01/21/2004 Document Revised: 07/25/2015 Document Reviewed: 08/07/2012  2017 Elsevier

## 2016-03-27 NOTE — Progress Notes (Signed)
21 y.o. G0P0000 Single  Caucasian Fe here for annual exam. Periods normal and regular with Paragard IUD. 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 Sexually active, no partner change, but would like vaginal STD screening. Happy with IUD choice. Sees Urgent care if needed. No health issues today.    Patient's last menstrual period was 03/04/2016 (exact date).          Sexually active: Yes.    The current method of family planning is IUD.    Exercising: No.  exercise Smoker:  no  Health Maintenance: Pap:  02-19-15 ascus MMG: none Colonoscopy:  none BMD:   none TDaP:  2014 Shingles: no Pneumonia: no Hep C and HIV: both neg 2017 Labs: poct urine-neg Self breast exam: not done   reports that she has never smoked. She has never used smokeless tobacco. She reports that she drinks about 0.6 oz of alcohol per week . She reports that she uses drugs, including Marijuana.  Past Medical History:  Diagnosis Date  . Abnormal Pap smear of cervix 2015   ASCUS/ High Risk HPV detected  . Anxiety   . Depression   . H/O cold sores   . History of spinal fracture    T12 compression fracture  . IBS (irritable bowel syndrome)   . Migraine without aura   . Skull fracture with cerebral contusion [facility](HCC)     Past Surgical History:  Procedure Laterality Date  . NO PAST SURGERIES      Current Outpatient Prescriptions  Medication Sig Dispense Refill  . acyclovir (ZOVIRAX) 400 MG tablet Take 1 tablet  2 x day  - Need office Visit before further refills 60 tablet 0  . ADDERALL XR 30 MG 24 hr capsule     . buPROPion (WELLBUTRIN XL) 150 MG 24 hr tablet TAKE 1 TABLET BY MOUTH ONCE EVERY MORNING 90 tablet 0   No current facility-administered medications for this visit.     Family History  Problem Relation Age of Onset  . Allergies Mother   . Stroke Paternal Grandfather     ROS:  Pertinent items are noted in HPI.  Otherwise, a comprehensive ROS was negative.  Exam:   BP 90/60   Pulse 64   Resp  16   Wt 129 lb (58.5 kg)   LMP 03/04/2016 (Exact Date)   BMI 22.67 kg/m    Ht Readings from Last 3 Encounters:  02/19/15 5' 3.25" (1.607 m)  08/09/14 5\' 3"  (1.6 m)  02/15/14 5' 3.5" (1.613 m) (38 %, Z= -0.31)*   * Growth percentiles are based on CDC 2-20 Years data.    General appearance: alert, cooperative and appears stated age Head: Normocephalic, without obvious abnormality, atraumatic Neck: no adenopathy, supple, symmetrical, trachea midline and thyroid normal to inspection and palpation Lungs: clear to auscultation bilaterally Breasts: normal appearance, no masses or tenderness, No nipple retraction or dimpling, No nipple discharge or bleeding, No axillary or supraclavicular adenopathy Heart: regular rate and rhythm Abdomen: soft, non-tender; no masses,  no organomegaly Extremities: extremities normal, atraumatic, no cyanosis or edema Skin: Skin color, texture, turgor normal. No rashes or lesions Lymph nodes: Cervical, supraclavicular, and axillary nodes normal. No abnormal inguinal nodes palpated Neurologic: Grossly normal   Pelvic: External genitalia:  no lesions              Urethra:  normal appearing urethra with no masses, tenderness or lesions              Bartholin's and  Skene's: normal                 Vagina: normal appearing vagina with normal color and discharge, no lesions              Cervix: no bleeding following Pap, no cervical motion tenderness, no lesions and IUD string noted in cervical os              Pap taken: Yes.   Bimanual Exam:  Uterus:  normal size, contour, position, consistency, mobility, non-tender              Adnexa: normal adnexa and no mass, fullness, tenderness               Rectovaginal: Confirms               Anus:  normal   Chaperone present: yes  A:  Well Woman with normal exam  Contraception Paragard IUD due for removal 02/26/25  STD screening  History of ASCUS pap 2016  P:   Reviewed health and wellness pertinent to  exam  Reviewed warning signs with IUD use and expected bleeding profile  Lab: GC,Chlamydia, Affirm  Pap smear as above taken   counseled on breast self exam, STD prevention, HIV risk factors and prevention, adequate intake of calcium and vitamin D, diet and exercise  return annually or prn  An After Visit Summary was printed and given to the patient.

## 2016-03-28 LAB — WET PREP BY MOLECULAR PROBE
Candida species: NEGATIVE
Gardnerella vaginalis: POSITIVE — AB
Trichomonas vaginosis: NEGATIVE

## 2016-03-29 ENCOUNTER — Other Ambulatory Visit: Payer: Self-pay | Admitting: Certified Nurse Midwife

## 2016-03-29 DIAGNOSIS — N760 Acute vaginitis: Principal | ICD-10-CM

## 2016-03-29 DIAGNOSIS — B9689 Other specified bacterial agents as the cause of diseases classified elsewhere: Secondary | ICD-10-CM

## 2016-03-29 MED ORDER — METRONIDAZOLE 500 MG PO TABS: 500.0000 mg | ORAL_TABLET | Freq: Two times a day (BID) | ORAL | 0 refills | Status: DC

## 2016-03-29 NOTE — Progress Notes (Signed)
Reviewed personally.  M. Suzanne Julliette Frentz, MD.  

## 2016-03-30 ENCOUNTER — Telehealth: Payer: Self-pay | Admitting: *Deleted

## 2016-03-30 NOTE — Telephone Encounter (Signed)
Message left to return call to Emily at [phone].    

## 2016-03-30 NOTE — Telephone Encounter (Signed)
-----   Message from Verner Choleborah S Leonard, CNM sent at 03/29/2016  9:29 PM EST ----- Notify patient that wet prep is positive for BV, negative for yeast and trichomonas. Order placed for Flagyl please give instructions Gc/chlamydia pending and pap

## 2016-03-30 NOTE — Telephone Encounter (Signed)
Patient returning your call.

## 2016-03-30 NOTE — Telephone Encounter (Signed)
Returned call to patient. Results reviewed with patient and she verbalized understanding. Instructions/ alcohol precautions given to patient for use of flagyl. Patient verbalized understanding. Patient states she will call [facility] to have them transfer the prescription to her pharmacy in [place] since she is back in school. RN advised to call office back if she has any trouble with prescription transfer. Patient agreeable. Aware once other tests result, she will be notified.   Routing to provider for final review. Patient agreeable to disposition. Will close encounter.

## 2016-04-01 LAB — IPS PAP TEST WITH REFLEX TO HPV

## 2016-04-01 LAB — IPS N GONORRHOEA AND CHLAMYDIA BY PCR

## 2016-05-07 ENCOUNTER — Other Ambulatory Visit: Payer: Self-pay

## 2016-10-31 DIAGNOSIS — J0301 Acute recurrent streptococcal tonsillitis: Secondary | ICD-10-CM

## 2016-10-31 HISTORY — DX: Acute recurrent streptococcal tonsillitis: J03.01

## 2016-11-06 DIAGNOSIS — J0301 Acute recurrent streptococcal tonsillitis: Secondary | ICD-10-CM | POA: Insufficient documentation

## 2016-11-09 ENCOUNTER — Encounter (HOSPITAL_BASED_OUTPATIENT_CLINIC_OR_DEPARTMENT_OTHER): Payer: Self-pay | Admitting: *Deleted

## 2016-11-09 NOTE — H&P (Signed)
Hannah Reeves is an 21 y.o. female.   Chief Complaint: recurrent strep throat HPI: recurrent strep infections over the past couple years.  Past Medical History:  Diagnosis Date  . Abnormal Pap smear of cervix 2015   ASCUS/ High Risk HPV detected  . Anxiety   . Depression   . H/O cold sores   . History of spinal fracture    T12 compression fracture  . IBS (irritable bowel syndrome)   . Migraine without aura   . Skull fracture with cerebral contusion [facility](HCC)     Past Surgical History:  Procedure Laterality Date  . NO PAST SURGERIES      Family History  Problem Relation Age of Onset  . Allergies Mother   . Stroke Paternal Grandfather    Social History:  reports that she has never smoked. She has never used smokeless tobacco. She reports that she drinks about 0.6 oz of alcohol per week . She reports that she uses drugs, including Marijuana.  Allergies:  Allergies  Allergen Reactions  . Peanut-Containing Drug Products Shortness Of Breath and Itching    TREE NUTS - ITCHING OF MOUTH   . Shellfish Allergy Shortness Of Breath and Itching    ITCHING OF MOUTH  . Penicillins Hives  . Sulfa Antibiotics Hives    No prescriptions prior to admission.    No results found for this or any previous visit (from the past 48 hour(s)). No results found.  ROS: otherwise negative  There were no vitals taken for this visit.  PHYSICAL EXAM: Overall appearance:  Healthy appearing, in no distress Head:  Normocephalic, atraumatic. Ears: External auditory canals are clear; tympanic membranes are intact and the middle ears are free of any effusion. Nose: External nose is healthy in appearance. Internal nasal exam free of any lesions or obstruction. Oral Cavity/pharynx:  There are no mucosal lesions or masses identified. Tonsils are 3+ enlarged. Hypopharynx/Larynx: no signs of any mucosal lesions or masses identified. Vocal cords move normally. Neuro:  No identifiable neurologic  deficits. Neck: No palpable neck masses.  Studies Reviewed: none    Assessment/Plan Recommend tonsillectomy.  [name] 11/09/2016, 4:27 PM

## 2016-11-11 ENCOUNTER — Ambulatory Visit (HOSPITAL_BASED_OUTPATIENT_CLINIC_OR_DEPARTMENT_OTHER): Payer: Managed Care, Other (non HMO) | Admitting: Anesthesiology

## 2016-11-11 ENCOUNTER — Encounter (HOSPITAL_BASED_OUTPATIENT_CLINIC_OR_DEPARTMENT_OTHER)
Admission: RE | Disposition: A | Discharge status: Home / Self Care | Payer: Self-pay | Source: Ambulatory Visit | Attending: Otolaryngology

## 2016-11-11 ENCOUNTER — Encounter (HOSPITAL_BASED_OUTPATIENT_CLINIC_OR_DEPARTMENT_OTHER): Payer: Self-pay | Admitting: Anesthesiology

## 2016-11-11 ENCOUNTER — Ambulatory Visit (HOSPITAL_BASED_OUTPATIENT_CLINIC_OR_DEPARTMENT_OTHER)
Admission: RE | Admit: 2016-11-11 | Discharge: 2016-11-11 | Disposition: A | Discharge status: Home / Self Care | Payer: Managed Care, Other (non HMO) | Source: Ambulatory Visit | Attending: Otolaryngology | Admitting: Otolaryngology

## 2016-11-11 DIAGNOSIS — Z91010 Allergy to peanuts: Secondary | ICD-10-CM | POA: Insufficient documentation

## 2016-11-11 DIAGNOSIS — Z882 Allergy status to sulfonamides status: Secondary | ICD-10-CM | POA: Diagnosis not present

## 2016-11-11 DIAGNOSIS — Z9089 Acquired absence of other organs: Secondary | ICD-10-CM

## 2016-11-11 DIAGNOSIS — Z823 Family history of stroke: Secondary | ICD-10-CM | POA: Insufficient documentation

## 2016-11-11 DIAGNOSIS — Z880 Allergy status to penicillin: Secondary | ICD-10-CM | POA: Diagnosis not present

## 2016-11-11 DIAGNOSIS — Z91013 Allergy to seafood: Secondary | ICD-10-CM | POA: Insufficient documentation

## 2016-11-11 DIAGNOSIS — K589 Irritable bowel syndrome without diarrhea: Secondary | ICD-10-CM | POA: Diagnosis not present

## 2016-11-11 DIAGNOSIS — F1290 Cannabis use, unspecified, uncomplicated: Secondary | ICD-10-CM | POA: Diagnosis not present

## 2016-11-11 DIAGNOSIS — F419 Anxiety disorder, unspecified: Secondary | ICD-10-CM | POA: Diagnosis not present

## 2016-11-11 DIAGNOSIS — F329 Major depressive disorder, single episode, unspecified: Secondary | ICD-10-CM | POA: Insufficient documentation

## 2016-11-11 DIAGNOSIS — F909 Attention-deficit hyperactivity disorder, unspecified type: Secondary | ICD-10-CM | POA: Insufficient documentation

## 2016-11-11 DIAGNOSIS — G43909 Migraine, unspecified, not intractable, without status migrainosus: Secondary | ICD-10-CM | POA: Insufficient documentation

## 2016-11-11 DIAGNOSIS — J020 Streptococcal pharyngitis: Secondary | ICD-10-CM | POA: Insufficient documentation

## 2016-11-11 HISTORY — DX: Acquired absence of other organs: Z90.89

## 2016-11-11 HISTORY — PX: TONSILLECTOMY: SHX5217

## 2016-11-11 HISTORY — DX: Acute recurrent streptococcal tonsillitis: J03.01

## 2016-11-11 HISTORY — DX: Personal history of traumatic brain injury: Z87.820

## 2016-11-11 HISTORY — DX: Other seasonal allergic rhinitis: J30.2

## 2016-11-11 LAB — POCT HEMOGLOBIN-HEMACUE: HEMOGLOBIN: 14 g/dL (ref 12.0–15.0)

## 2016-11-11 SURGERY — TONSILLECTOMY
Anesthesia: General

## 2016-11-11 MED ORDER — HYDROCODONE-ACETAMINOPHEN 7.5-325 MG/15ML PO SOLN
ORAL | Status: AC
  Filled 2016-11-11: qty 15

## 2016-11-11 MED ORDER — SCOPOLAMINE 1 MG/3DAYS TD PT72
1.0000 | MEDICATED_PATCH | Freq: Once | TRANSDERMAL | Status: AC | PRN
  Administered 2016-11-11: 1 via TRANSDERMAL

## 2016-11-11 MED ORDER — HYDROMORPHONE HCL 1 MG/ML IJ SOLN
INTRAMUSCULAR | Status: AC
  Filled 2016-11-11: qty 0.5

## 2016-11-11 MED ORDER — HYDROCODONE-ACETAMINOPHEN 7.5-325 MG/15ML PO SOLN: 10.0000 mL | Freq: Once | ORAL | Status: DC | PRN

## 2016-11-11 MED ORDER — AMPHETAMINE-DEXTROAMPHET ER 30 MG PO CP24: 30.0000 mg | ORAL_CAPSULE | Freq: Every day | ORAL | Status: DC

## 2016-11-11 MED ORDER — FENTANYL CITRATE (PF) 100 MCG/2ML IJ SOLN
INTRAMUSCULAR | Status: AC
  Filled 2016-11-11: qty 2

## 2016-11-11 MED ORDER — ONDANSETRON HCL 4 MG/2ML IJ SOLN
INTRAMUSCULAR | Status: AC
  Filled 2016-11-11: qty 2

## 2016-11-11 MED ORDER — LIDOCAINE-EPINEPHRINE 1 %-1:100000 IJ SOLN
INTRAMUSCULAR | Status: AC
  Filled 2016-11-11: qty 1

## 2016-11-11 MED ORDER — SUCCINYLCHOLINE CHLORIDE 20 MG/ML IJ SOLN
INTRAMUSCULAR | Status: DC | PRN
  Administered 2016-11-11: 120 mg via INTRAVENOUS

## 2016-11-11 MED ORDER — PROPOFOL 500 MG/50ML IV EMUL
INTRAVENOUS | Status: AC
  Filled 2016-11-11: qty 50

## 2016-11-11 MED ORDER — PROMETHAZINE HCL 25 MG RE SUPP: 25.0000 mg | Freq: Four times a day (QID) | RECTAL | 1 refills | Status: DC | PRN

## 2016-11-11 MED ORDER — BACITRACIN ZINC 500 UNIT/GM EX OINT: 1.0000 "application " | TOPICAL_OINTMENT | Freq: Three times a day (TID) | CUTANEOUS | Status: DC

## 2016-11-11 MED ORDER — IBUPROFEN 100 MG/5ML PO SUSP: 400.0000 mg | Freq: Four times a day (QID) | ORAL | Status: DC | PRN

## 2016-11-11 MED ORDER — HYDROCODONE-ACETAMINOPHEN 7.5-325 MG/15ML PO SOLN
15.0000 mL | Freq: Once | ORAL | Status: AC | PRN
  Administered 2016-11-11: 15 mL via ORAL

## 2016-11-11 MED ORDER — OXYMETAZOLINE HCL 0.05 % NA SOLN
NASAL | Status: AC
  Filled 2016-11-11: qty 15

## 2016-11-11 MED ORDER — MIDAZOLAM HCL 2 MG/2ML IJ SOLN
1.0000 mg | INTRAMUSCULAR | Status: DC | PRN
  Administered 2016-11-11: 2 mg via INTRAVENOUS

## 2016-11-11 MED ORDER — PROMETHAZINE HCL 25 MG RE SUPP: 25.0000 mg | Freq: Four times a day (QID) | RECTAL | Status: DC | PRN

## 2016-11-11 MED ORDER — HYDROCODONE-ACETAMINOPHEN 7.5-325 MG/15ML PO SOLN: 15.0000 mL | Freq: Four times a day (QID) | ORAL | 0 refills | Status: DC | PRN

## 2016-11-11 MED ORDER — SCOPOLAMINE 1 MG/3DAYS TD PT72: 1.0000 | MEDICATED_PATCH | Freq: Once | TRANSDERMAL | Status: DC

## 2016-11-11 MED ORDER — HYDROCODONE-ACETAMINOPHEN 7.5-325 MG/15ML PO SOLN: 10.0000 mL | ORAL | Status: DC | PRN

## 2016-11-11 MED ORDER — PHENOL 1.4 % MT LIQD: 1.0000 | OROMUCOSAL | Status: DC | PRN

## 2016-11-11 MED ORDER — ONDANSETRON HCL 4 MG/2ML IJ SOLN
INTRAMUSCULAR | Status: DC | PRN
  Administered 2016-11-11: 4 mg via INTRAVENOUS

## 2016-11-11 MED ORDER — HYDROMORPHONE HCL 1 MG/ML IJ SOLN
0.2500 mg | INTRAMUSCULAR | Status: DC | PRN
  Administered 2016-11-11 (×2): 0.25 mg via INTRAVENOUS
  Administered 2016-11-11 (×2): 0.5 mg via INTRAVENOUS

## 2016-11-11 MED ORDER — DEXTROSE-NACL 5-0.9 % IV SOLN
INTRAVENOUS | Status: DC
  Administered 2016-11-11: 11:00:00 via INTRAVENOUS

## 2016-11-11 MED ORDER — MEPERIDINE HCL 25 MG/ML IJ SOLN: 6.2500 mg | INTRAMUSCULAR | Status: DC | PRN

## 2016-11-11 MED ORDER — LIDOCAINE HCL (CARDIAC) 20 MG/ML IV SOLN
INTRAVENOUS | Status: DC | PRN
  Administered 2016-11-11: 40 mg via INTRAVENOUS

## 2016-11-11 MED ORDER — DEXAMETHASONE SODIUM PHOSPHATE 4 MG/ML IJ SOLN
INTRAMUSCULAR | Status: DC | PRN
  Administered 2016-11-11: 10 mg via INTRAVENOUS

## 2016-11-11 MED ORDER — LACTATED RINGERS IV SOLN
INTRAVENOUS | Status: DC
  Administered 2016-11-11 (×2): via INTRAVENOUS

## 2016-11-11 MED ORDER — MIDAZOLAM HCL 2 MG/2ML IJ SOLN: 0.5000 mg | Freq: Once | INTRAMUSCULAR | Status: DC | PRN

## 2016-11-11 MED ORDER — BACITRACIN ZINC 500 UNIT/GM EX OINT
TOPICAL_OINTMENT | CUTANEOUS | Status: AC
  Filled 2016-11-11: qty 28.35

## 2016-11-11 MED ORDER — PROPOFOL 10 MG/ML IV BOLUS
INTRAVENOUS | Status: DC | PRN
  Administered 2016-11-11: 200 mg via INTRAVENOUS
  Administered 2016-11-11: 50 mg via INTRAVENOUS

## 2016-11-11 MED ORDER — PROMETHAZINE HCL 25 MG PO TABS: 25.0000 mg | ORAL_TABLET | Freq: Four times a day (QID) | ORAL | Status: DC | PRN

## 2016-11-11 MED ORDER — FENTANYL CITRATE (PF) 100 MCG/2ML IJ SOLN
INTRAMUSCULAR | Status: AC
Start: 2016-11-11 — End: ?
  Filled 2016-11-11: qty 2

## 2016-11-11 MED ORDER — DEXAMETHASONE SODIUM PHOSPHATE 10 MG/ML IJ SOLN
INTRAMUSCULAR | Status: AC
  Filled 2016-11-11: qty 1

## 2016-11-11 MED ORDER — PROMETHAZINE HCL 25 MG/ML IJ SOLN: 6.2500 mg | INTRAMUSCULAR | Status: DC | PRN

## 2016-11-11 MED ORDER — MIDAZOLAM HCL 2 MG/2ML IJ SOLN
INTRAMUSCULAR | Status: AC
  Filled 2016-11-11: qty 2

## 2016-11-11 MED ORDER — BACIT-POLY-NEO HC 1 % EX OINT
TOPICAL_OINTMENT | CUTANEOUS | Status: AC
  Filled 2016-11-11: qty 15

## 2016-11-11 MED ORDER — FENTANYL CITRATE (PF) 100 MCG/2ML IJ SOLN
50.0000 ug | INTRAMUSCULAR | Status: AC | PRN
  Administered 2016-11-11: 100 ug via INTRAVENOUS
  Administered 2016-11-11 (×2): 50 ug via INTRAVENOUS

## 2016-11-11 MED ORDER — CLINDAMYCIN HCL 300 MG PO CAPS: 300.0000 mg | ORAL_CAPSULE | Freq: Three times a day (TID) | ORAL | Status: DC

## 2016-11-11 SURGICAL SUPPLY — 29 items
CANISTER SUCT 1200ML W/VALVE (MISCELLANEOUS) ×2 IMPLANT
CATH ROBINSON RED A/P 12FR (CATHETERS) ×2 IMPLANT
COAGULATOR SUCT SWTCH 10FR 6 (ELECTROSURGICAL) ×2 IMPLANT
COVER BACK TABLE 60X90IN (DRAPES) ×2 IMPLANT
COVER MAYO STAND STRL (DRAPES) ×2 IMPLANT
ELECT COATED BLADE 2.86 ST (ELECTRODE) ×2 IMPLANT
ELECT REM PT RETURN 9FT ADLT (ELECTROSURGICAL)
ELECT REM PT RETURN 9FT PED (ELECTROSURGICAL)
ELECTRODE REM PT RETRN 9FT PED (ELECTROSURGICAL) IMPLANT
ELECTRODE REM PT RTRN 9FT ADLT (ELECTROSURGICAL) IMPLANT
GAUZE SPONGE 4X4 12PLY STRL LF (GAUZE/BANDAGES/DRESSINGS) ×2 IMPLANT
GLOVE BIOGEL PI IND STRL 7.0 (GLOVE) IMPLANT
GLOVE BIOGEL PI INDICATOR 7.0 (GLOVE) ×1
GLOVE ECLIPSE 6.5 STRL STRAW (GLOVE) ×1 IMPLANT
GLOVE ECLIPSE 7.5 STRL STRAW (GLOVE) ×2 IMPLANT
GOWN STRL REUS W/ TWL LRG LVL3 (GOWN DISPOSABLE) ×2 IMPLANT
GOWN STRL REUS W/TWL LRG LVL3 (GOWN DISPOSABLE) ×4
MARKER SKIN DUAL TIP RULER LAB (MISCELLANEOUS) IMPLANT
NS IRRIG 1000ML POUR BTL (IV SOLUTION) ×2 IMPLANT
PENCIL FOOT CONTROL (ELECTRODE) ×2 IMPLANT
SHEET MEDIUM DRAPE 40X70 STRL (DRAPES) ×2 IMPLANT
SOLUTION BUTLER CLEAR DIP (MISCELLANEOUS) IMPLANT
SPONGE TONSIL 1 RF SGL (DISPOSABLE) IMPLANT
SPONGE TONSIL 1.25 RF SGL STRG (GAUZE/BANDAGES/DRESSINGS) IMPLANT
SYR BULB 3OZ (MISCELLANEOUS) ×2 IMPLANT
TOWEL OR 17X24 6PK STRL BLUE (TOWEL DISPOSABLE) ×2 IMPLANT
TUBE CONNECTING 20X1/4 (TUBING) ×2 IMPLANT
TUBE SALEM SUMP 12R W/ARV (TUBING) IMPLANT
TUBE SALEM SUMP 16 FR W/ARV (TUBING) ×1 IMPLANT

## 2016-11-11 NOTE — Interval H&P Note (Signed)
History and Physical Interval Note:  11/11/2016 8:06 AM  Hannah Reeves  has presented today for surgery, with the diagnosis of RECURRENT STREP THROAT  The various methods of treatment have been discussed with the patient and family. After consideration of risks, benefits and other options for treatment, the patient has consented to  Procedure(s): TONSILLECTOMY (N/A) as a surgical intervention .  The patient's history has been reviewed, patient examined, no change in status, stable for surgery.  I have reviewed the patient's chart and labs.  Questions were answered to the patient's satisfaction.     [name]

## 2016-11-11 NOTE — Op Note (Signed)
11/11/2016  9:00 AM  PATIENT:  Hannah Reeves  21 y.o. female  PRE-OPERATIVE DIAGNOSIS:  * No pre-op diagnosis entered *  POST-OPERATIVE DIAGNOSIS:  RECURRENT STREP THROAT  PROCEDURE:  Procedure(s): TONSILLECTOMY  SURGEON:  Surgeon(s): Serena Colonelosen, Olando Willems, MD  ANESTHESIA:   General  COUNTS: Correct   DICTATION: The patient was taken to the operating room and placed on the operating table in the supine position. Following induction of general endotracheal anesthesia, the table was turned and the patient was draped in a standard fashion. A Crowe-Davis mouthgag was inserted into the oral cavity and used to retract the tongue and mandible, then attached to the Mayo stand.  The tonsillectomy was then performed using electrocautery dissection, carefully dissecting the avascular plane between the capsule and constrictor muscles. Cautery was used for completion of hemostasis. The tonsils were very large and fibrotic , and were discarded.  The pharynx was irrigated with saline and suctioned. An oral gastric tube was used to aspirate the contents of the stomach. The patient was then awakened from anesthesia and transferred to PACU in stable condition.   PATIENT DISPOSITION:  To PACA, stable

## 2016-11-11 NOTE — Anesthesia Preprocedure Evaluation (Signed)
Anesthesia Evaluation  Patient identified by MRN, date of birth, ID band Patient awake    Reviewed: Allergy & Precautions, NPO status , Patient's Chart, lab work & pertinent test results  History of Anesthesia Complications Negative for: history of anesthetic complications  Airway Mallampati: I  TM Distance: >3 FB Neck ROM: Full    Dental  (+) Dental Advisory Given   Pulmonary neg pulmonary ROS, Current Smoker,    breath sounds clear to auscultation       Cardiovascular negative cardio ROS   Rhythm:Regular Rate:Normal     Neuro/Psych  Headaches, Anxiety Depression    GI/Hepatic negative GI ROS, Neg liver ROS,   Endo/Other  negative endocrine ROS  Renal/GU negative Renal ROS     Musculoskeletal   Abdominal   Peds  (+) ADHD Hematology negative hematology ROS (+)   Anesthesia Other Findings   Reproductive/Obstetrics LMP 2 weeks ago, IUD                             Anesthesia Physical Anesthesia Plan  ASA: II  Anesthesia Plan: General   Post-op Pain Management:    Induction: Intravenous  PONV Risk Score and Plan: 4 or greater and Ondansetron, Dexamethasone, Midazolam and Scopolamine patch - Pre-op  Airway Management Planned: Oral ETT  Additional Equipment:   Intra-op Plan:   Post-operative Plan: Extubation in OR  Informed Consent: I have reviewed the patients History and Physical, chart, labs and discussed the procedure including the risks, benefits and alternatives for the proposed anesthesia with the patient or authorized representative who has indicated his/her understanding and acceptance.   Dental advisory given  Plan Discussed with: CRNA and Surgeon  Anesthesia Plan Comments:         Anesthesia Quick Evaluation

## 2016-11-11 NOTE — Transfer of Care (Signed)
Immediate Anesthesia Transfer of Care Note  Patient: Hannah Reeves  Procedure(s) Performed: Procedure(s): TONSILLECTOMY (N/A)  Patient Location: PACU  Anesthesia Type:General  Level of Consciousness: awake, alert  and oriented  Airway & Oxygen Therapy: Patient Spontanous Breathing and Patient connected to face mask oxygen  Post-op Assessment: Report given to RN and Post -op Vital signs reviewed and stable  Post vital signs: Reviewed and stable  Last Vitals:  Vitals:   11/11/16 0758  BP: 120/64  Pulse: 90  Resp: 16  Temp: 36.6 C  SpO2: 100%    Last Pain:  Vitals:   11/11/16 0758  TempSrc: Oral      Patients Stated Pain Goal: 2 (11/11/16 0758)  Complications: No apparent anesthesia complications

## 2016-11-11 NOTE — Anesthesia Postprocedure Evaluation (Signed)
Anesthesia Post Note  Patient: Hannah Reeves  Procedure(s) Performed: Procedure(s) (LRB): TONSILLECTOMY (N/A)     Patient location during evaluation: PACU Anesthesia Type: General Level of consciousness: awake and alert, patient cooperative and oriented Pain management: pain level controlled Vital Signs Assessment: post-procedure vital signs reviewed and stable Respiratory status: spontaneous breathing, nonlabored ventilation and respiratory function stable Cardiovascular status: blood pressure returned to baseline and stable Postop Assessment: no signs of nausea or vomiting Anesthetic complications: no    Last Vitals:  Vitals:   11/11/16 1030 11/11/16 1046  BP: 98/81 119/67  Pulse: 76 86  Resp: 12 16  Temp:  (!) 36.4 C  SpO2: 100% 97%    Last Pain:  Vitals:   11/11/16 1046  TempSrc:   PainSc: 2                  [name],E. Cristine Daw

## 2016-11-11 NOTE — Anesthesia Procedure Notes (Signed)
Procedure Name: Intubation Date/Time: 11/11/2016 8:39 AM Performed by: Maryella Shivers Pre-anesthesia Checklist: Patient identified, Emergency Drugs available, Suction available and Patient being monitored Patient Re-evaluated:Patient Re-evaluated prior to induction Oxygen Delivery Method: Circle system utilized Preoxygenation: Pre-oxygenation with 100% oxygen Induction Type: IV induction Ventilation: Mask ventilation without difficulty Laryngoscope Size: Mac and 3 Grade View: Grade I Tube type: Oral Tube size: 7.0 mm Number of attempts: 1 Airway Equipment and Method: Stylet and Oral airway Placement Confirmation: ETT inserted through vocal cords under direct vision,  positive ETCO2 and breath sounds checked- equal and bilateral Secured at: 20 cm Tube secured with: Tape Dental Injury: Teeth and Oropharynx as per pre-operative assessment

## 2016-11-11 NOTE — Discharge Instructions (Signed)
Tonsillectomy, Adult, Care After This sheet gives you information about how to care for yourself after your procedure. Your health care provider may also give you more specific instructions. If you have problems or questions, contact your health care provider. What can I expect after the procedure? After your procedure, it is common to have:  A numb tongue.  A reduced sense of taste.  Difficulty swallowing and pain when swallowing.  Pain or a clicking noise when yawning or chewing.  Liquids that you drink leaking out of your nose.  A muffled sound to your voice.  Swelling in the middle of the roof of the mouth (uvula).  A constant cough and a need to clear mucus and phlegm from your throat.  Specks of blood in your saliva or when you blow your nose.  Snoring or breathing through the mouth during sleep.  A thick, white scab that forms where the tonsils used to be. It will cause bad breath.  Follow these instructions at home: Eating and drinking  Follow instructions from your health care provider about eating or drinking restrictions.  For the first several days after surgery, choose foods that are soft and cold, such as gelatin, sherbet, ice cream, and frozen ice pops. These types of foods are usually the easiest to eat.  If you have nausea or vomiting, start with liquids that are cold and that you can see through (clear liquids), such as water and apple juice without pulp. When you can tolerate these fluids safely, you may advance to thicker liquids and soft foods, such as: ? Creamed soups. ? Soft, warm cereals, such as oatmeal or hot wheat cereal. ? Milk. ? Mashed potatoes. ? Applesauce.  Drink enough fluid to keep your urine clear or pale yellow. Driving  Do not drive for 24 hours if you were given a medicine to help you relax (sedative).  Do not drive or use heavy machinery while taking prescription pain medicine or until your health care provider approves. General  instructions  Rest.  Keep your head raised (elevated) at all times when lying down.  Take over-the-counter and prescription medicines only as told by your health care provider.  Do not use mouthwashes until your health care provider approves.  Gargle only as told by your health care provider.  Avoid contact with people who have infections, such as colds and sore throats. Contact a health care provider if:  Your pain gets worse or is not controlled with medicines.  You have a fever.  You have a rash.  You feel light-headed or you faint.  You are unable to swallow even small amounts of liquid or saliva.  Your urine is very dark. Get help right away if:  You have trouble breathing.  You bleed bright red blood from your throat.  You vomit bright red blood. Summary  Follow instructions from your health care provider about eating or drinking restrictions. For the first several days after surgery, soft and cold foods are usually the easiest to eat.  Talk with your health care provider about ways to manage your pain. Keeping pain under control can help you rest and make swallowing easier.  Small specks of blood in your saliva is normal after surgery. Bleeding more than this is a serious complication. Get help right away if you bleed bright red blood from your throat or you vomit bright red blood. This information is not intended to replace advice given to you by your health care provider. Make sure you  discuss any questions you have with your health care provider. °Document Released: 12/18/2003 Document Revised: 01/10/2016 Document Reviewed: 01/10/2016 °Elsevier Interactive Patient Education © 2017 Elsevier Inc. ° ° ° °Post Anesthesia Home Care Instructions ° °Activity: °Get plenty of rest for the remainder of the day. A responsible individual must stay with you for 24 hours following the procedure.  °For the next 24 hours, DO NOT: °-Drive a car °-Operate machinery °-Drink alcoholic  beverages °-Take any medication unless instructed by your physician °-Make any legal decisions or sign important papers. ° °Meals: °Start with liquid foods such as gelatin or soup. Progress to regular foods as tolerated. Avoid greasy, spicy, heavy foods. If nausea and/or vomiting occur, drink only clear liquids until the nausea and/or vomiting subsides. Call your physician if vomiting continues. ° °Special Instructions/Symptoms: °Your throat may feel dry or sore from the anesthesia or the breathing tube placed in your throat during surgery. If this causes discomfort, gargle with warm salt water. The discomfort should disappear within 24 hours. ° °If you had a scopolamine patch placed behind your ear for the management of post- operative nausea and/or vomiting: ° °1. The medication in the patch is effective for 72 hours, after which it should be removed.  Wrap patch in a tissue and discard in the trash. Wash hands thoroughly with soap and water. °2. You may remove the patch earlier than 72 hours if you experience unpleasant side effects which may include dry mouth, dizziness or visual disturbances. °3. Avoid touching the patch. Wash your hands with soap and water after contact with the patch. °  ° ° °

## 2016-11-12 ENCOUNTER — Encounter (HOSPITAL_BASED_OUTPATIENT_CLINIC_OR_DEPARTMENT_OTHER): Payer: Self-pay | Admitting: Otolaryngology

## 2016-12-06 ENCOUNTER — Emergency Department (HOSPITAL_COMMUNITY)
Admission: EM | Admit: 2016-12-06 | Discharge: 2016-12-06 | Disposition: A | Discharge status: Home / Self Care | Payer: Managed Care, Other (non HMO) | Attending: Emergency Medicine | Admitting: Emergency Medicine

## 2016-12-06 ENCOUNTER — Encounter (HOSPITAL_COMMUNITY): Payer: Self-pay | Admitting: Emergency Medicine

## 2016-12-06 DIAGNOSIS — T7840XA Allergy, unspecified, initial encounter: Secondary | ICD-10-CM | POA: Insufficient documentation

## 2016-12-06 DIAGNOSIS — Z5321 Procedure and treatment not carried out due to patient leaving prior to being seen by health care provider: Secondary | ICD-10-CM | POA: Insufficient documentation

## 2016-12-06 NOTE — ED Notes (Signed)
Made EDP aware of patient. Patient speaking full sentences without difficulty. Airway intact. Laughing in triage room. Saturating at 100% RA.

## 2016-12-06 NOTE — ED Triage Notes (Signed)
Pt comes in after consuming walnuts.  Pt reports being allergic but does not carry an epi pen with her anymore. States she hasn't consumed them since she was little but reports anaphylaxis with consumption.

## 2016-12-06 NOTE — ED Notes (Signed)
Went to move patient to fast track room and patient was no longer in triage room.  Waited for patient to return.  Assumed elopement.

## 2017-02-04 ENCOUNTER — Ambulatory Visit: Payer: Self-pay | Admitting: Internal Medicine

## 2017-02-08 ENCOUNTER — Ambulatory Visit: Payer: Self-pay | Admitting: Internal Medicine

## 2017-02-15 ENCOUNTER — Ambulatory Visit (INDEPENDENT_AMBULATORY_CARE_PROVIDER_SITE_OTHER): Payer: Managed Care, Other (non HMO) | Admitting: Internal Medicine

## 2017-02-15 VITALS — BP 104/76 | HR 108 | Temp 97.6°F | Resp 16 | Ht 64.5 in | Wt 138.6 lb

## 2017-02-15 DIAGNOSIS — F988 Other specified behavioral and emotional disorders with onset usually occurring in childhood and adolescence: Secondary | ICD-10-CM

## 2017-02-15 MED ORDER — AMPHETAMINE-DEXTROAMPHETAMINE 20 MG PO TABS: ORAL_TABLET | ORAL | 0 refills | Status: DC

## 2017-02-15 NOTE — Patient Instructions (Signed)
Amphetamine; Dextroamphetamine tablets What is this medicine? AMPHETAMINE; DEXTROAMPHETAMINE(am FET a meen; dex troe am FET a meen) is used to treat attention-deficit hyperactivity disorder (ADHD). It may also be used for narcolepsy. Federal law prohibits giving this medicine to any person other than the person for whom it was prescribed. Do not share this medicine with anyone else. This medicine may be used for other purposes; ask your health care provider or pharmacist if you have questions. COMMON BRAND NAME(S): Adderall What should I tell my health care provider before I take this medicine? They need to know if you have any of these conditions: -anxiety or panic attacks -circulation problems in fingers and toes -glaucoma -hardening or blockages of the arteries or heart blood vessels -heart disease or a heart defect -high blood pressure -history of a drug or alcohol abuse problem -history of stroke -kidney disease -liver disease -mental illness -seizures -suicidal thoughts, plans, or attempt; a previous suicide attempt by you or a family member -thyroid disease -Tourette's syndrome -an unusual or allergic reaction to dextroamphetamine, other amphetamines, other medicines, foods, dyes, or preservatives -pregnant or trying to get pregnant -breast-feeding How should I use this medicine? Take this medicine by mouth with a glass of water. Follow the directions on the prescription label. Take your doses at regular intervals. Do not take your medicine more often than directed. Do not suddenly stop your medicine. You must gradually reduce the dose or you may feel withdrawal effects. Ask your doctor or health care professional for advice. Talk to your pediatrician regarding the use of this medicine in children. Special care may be needed. While this drug may be prescribed for children as young as 3 years for selected conditions, precautions do apply. Overdosage: If you think you have taken too  much of this medicine contact a poison control center or emergency room at once. NOTE: This medicine is only for you. Do not share this medicine with others. What if I miss a dose? If you miss a dose, take it as soon as you can. If it is almost time for your next dose, take only that dose. Do not take double or extra doses. What may interact with this medicine? Do not take this medicine with any of the following medications: -MAOIS like Carbex, Eldepryl, Marplan, Nardil, and Parnate -other stimulant medicines for attention disorders, weight loss, or to stay awake This medicine may also interact with the following medications: -acetazolamide -ammonium chloride -antacids -ascorbic acid -atomoxetine -caffeine -certain medicines for blood pressure -certain medicines for depression, anxiety, or psychotic disturbances -certain medicines for seizures like carbamazepine, phenobarbital, phenytoin -certain medicines for stomach problems like cimetidine, famotidine, omeprazole, lansoprazole -cold or allergy medicines -glutamic acid -lithium -meperidine -methenamine; sodium acid phosphate -narcotic medicines for pain -norepinephrine -phenothiazines like chlorpromazine, mesoridazine, prochlorperazine, thioridazine -sodium acid phosphate -sodium bicarbonate This list may not describe all possible interactions. Give your health care provider a list of all the medicines, herbs, non-prescription drugs, or dietary supplements you use. Also tell them if you smoke, drink alcohol, or use illegal drugs. Some items may interact with your medicine. What should I watch for while using this medicine? Visit your doctor or health care professional for regular checks on your progress. This prescription requires that you follow special procedures with your doctor and pharmacy. You will need to have a new written prescription from your doctor every time you need a refill. This medicine may affect your  concentration, or hide signs of tiredness. Until you know how this   medicine affects you, do not drive, ride a bicycle, use machinery, or do anything that needs mental alertness. Tell your doctor or health care professional if this medicine loses its effects, or if you feel you need to take more than the prescribed amount. Do not change the dosage without talking to your doctor or health care professional. Decreased appetite is a common side effect when starting this medicine. Eating small, frequent meals or snacks can help. Talk to your doctor if you continue to have poor eating habits. Height and weight growth of a child taking this medicine will be monitored closely. Do not take this medicine close to bedtime. It may prevent you from sleeping. If you are going to need surgery, a MRI, CT scan, or other procedure, tell your doctor that you are taking this medicine. You may need to stop taking this medicine before the procedure. Tell your doctor or healthcare professional right away if you notice unexplained wounds on your fingers and toes while taking this medicine. You should also tell your healthcare provider if you experience numbness or pain, changes in the skin color, or sensitivity to temperature in your fingers or toes. What side effects may I notice from receiving this medicine? Side effects that you should report to your doctor or health care professional as soon as possible: -allergic reactions like skin rash, itching or hives, swelling of the face, lips, or tongue -changes in vision -chest pain or chest tightness -confusion, trouble speaking or understanding -fast, irregular heartbeat -fingers or toes feel numb, cool, painful -hallucination, loss of contact with reality -high blood pressure -males: prolonged or painful erection -seizures -severe headaches -shortness of breath -suicidal thoughts or other mood changes -trouble walking, dizziness, loss of balance or  coordination -uncontrollable head, mouth, neck, arm, or leg movements Side effects that usually do not require medical attention (report to your doctor or health care professional if they continue or are bothersome): -anxious -headache -loss of appetite -nausea, vomiting -trouble sleeping -weight loss This list may not describe all possible side effects. Call your doctor for medical advice about side effects. You may report side effects to FDA at 1-800-FDA-1088. Where should I keep my medicine? Keep out of the reach of children. This medicine can be abused. Keep your medicine in a safe place to protect it from theft. Do not share this medicine with anyone. Selling or giving away this medicine is dangerous and against the law. Store at room temperature between 15 and 30 degrees C (59 and 86 degrees F). Keep container tightly closed. Throw away any unused medicine after the expiration date. Dispose of properly. This medicine may cause accidental overdose and death if it is taken by other adults, children, or pets. Mix any unused medicine with a substance like cat litter or coffee grounds. Then throw the medicine away in a sealed container like a sealed bag or a coffee can with a lid. Do not use the medicine after the expiration date. NOTE: This sheet is a summary. It may not cover all possible information. If you have questions about this medicine, talk to your doctor, pharmacist, or health care provider.  2018 Elsevier/Gold Standard (2013-12-20 18:44:41)  

## 2017-02-16 ENCOUNTER — Encounter: Payer: Self-pay | Admitting: Internal Medicine

## 2017-02-16 NOTE — Progress Notes (Signed)
[facility]   Lucky CowboyWilliam Chasidy Janak, M.D.    Dyanne CarrelAmanda R. Steffanie Dunnollier, P.A.-C      Judd GaudierAshley Corbett, DNP [facility]                [address]                [place], [place] [id number] Telephone [phone] Telefax [phone]  Subjective:    Patient ID: Hannah GuKrista A Kurtenbach, female    DOB: 08/07/1995, 21 y.o.   MRN: [id number]  HPI    This nice 21 yo single WF return after a 2 year hiatus having graduated this past June from [employer] and currently job searching. She has hx/o Migraines, IBS, ADD and depression and was follow while at [facility] by a psychiatrist for management of psychotropic meds for anxiety depression and ADD. Today she presents for refills for Adderall and apparently has tapered off of Wellbutrin, Zoloft in the past. In 2016, after a fall she had a head injury with a concussion and PTBI and had neuropsychic testing by psychologist  Dr Leonides CaveZelson. She graduated from college with a 3+ GPA. Se doe ascribe difficulties with focus, concentration and staying on task.   Medication Sig  . cetirizine (ZYRTEC) 10 MG tablet Take 10 mg by mouth daily.   Past Medical History:  Diagnosis Date  . Anxiety   . Depression   . History of traumatic brain injury   . IBS (irritable bowel syndrome)    no current med.  . Migraines   . 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 Seasonal allergies    Past Surgical History:  Procedure Laterality Date  . TONSILLECTOMY N/A 11/11/2016   Procedure: TONSILLECTOMY;  Surgeon: Serena Colonelosen, Jefry, MD;  Location: [facility];  Service: ENT;  Laterality: N/A;  . WISDOM TOOTH EXTRACTION     Review of Systems  10 point systems review negative except as above.    Objective:   Physical Exam  BP 104/76   Pulse (!) 108   Temp 97.6 F (36.4 C)   Resp 16   Ht 5' 4.5" (1.638 m)   Wt 138 lb 9.6 oz (62.9 kg)   BMI 23.42 kg/m   HEENT - WNL. Neck - supple.  Chest - Clear equal BS. Cor - Nl HS.  RRR w/o sig MGR. PP 1(+). No edema. MS- FROM w/o deformities.  Gait Nl. Neuro -  Nl w/o focal abnormalities. Psyche- A & O x 3 No delusions or sociopathic tendencies.     Assessment & Plan:   1. Attention deficit disorder (ADD) without hyperactivity  - amphetamine-dextroamphetamine (ADDERALL) 20 MG tablet; Take 1/2 to 1 tablet 1 or 2 x / day for ADD- maximum of 5 days / week to avoid addiction  Dispense: 60 tablet; Refill: 0  - Discussed meds / SE's & ROV 6 mo or PRN

## 2017-04-13 ENCOUNTER — Encounter: Payer: Self-pay | Admitting: Certified Nurse Midwife

## 2017-04-13 ENCOUNTER — Other Ambulatory Visit: Payer: Self-pay

## 2017-04-13 ENCOUNTER — Ambulatory Visit (INDEPENDENT_AMBULATORY_CARE_PROVIDER_SITE_OTHER): Payer: Managed Care, Other (non HMO) | Admitting: Certified Nurse Midwife

## 2017-04-13 VITALS — BP 110/76 | HR 70 | Resp 16 | Ht 63.25 in | Wt 131.0 lb

## 2017-04-13 DIAGNOSIS — Z01419 Encounter for gynecological examination (general) (routine) without abnormal findings: Secondary | ICD-10-CM

## 2017-04-13 DIAGNOSIS — Z113 Encounter for screening for infections with a predominantly sexual mode of transmission: Secondary | ICD-10-CM | POA: Diagnosis not present

## 2017-04-13 NOTE — Patient Instructions (Signed)
General topics  Next pap or exam is  due in 1 year Take a Women's multivitamin Take 1200 mg. of calcium daily - prefer dietary If any concerns in interim to call back  Breast Self-Awareness Practicing breast self-awareness may pick up problems early, prevent significant medical complications, and possibly save your life. By practicing breast self-awareness, you can become familiar with how your breasts look and feel and if your breasts are changing. This allows you to notice changes early. It can also offer you some reassurance that your breast health is good. One way to learn what is normal for your breasts and whether your breasts are changing is to do a breast self-exam. If you find a lump or something that was not present in the past, it is best to contact your caregiver right away. Other findings that should be evaluated by your caregiver include nipple discharge, especially if it is bloody; skin changes or reddening; areas where the skin seems to be pulled in (retracted); or new lumps and bumps. Breast pain is seldom associated with cancer (malignancy), but should also be evaluated by a caregiver. BREAST SELF-EXAM The best time to examine your breasts is 5 7 days after your menstrual period is over.  ExitCare Patient Information 2013 [employer].   Exercise to Stay Healthy Exercise helps you become and stay healthy. EXERCISE IDEAS AND TIPS Choose exercises that:  You enjoy.  Fit into your day. You do not need to exercise really hard to be healthy. You can do exercises at a slow or medium level and stay healthy. You can:  Stretch before and after working out.  Try yoga, Pilates, or tai chi.  Lift weights.  Walk fast, swim, jog, run, climb stairs, bicycle, dance, or rollerskate.  Take aerobic classes. Exercises that burn about 150 calories:  Running 1  miles in 15 minutes.  Playing volleyball for 45 to 60 minutes.  Washing and waxing a car for 45 to 60  minutes.  Playing touch football for 45 minutes.  Walking 1  miles in 35 minutes.  Pushing a stroller 1  miles in 30 minutes.  Playing basketball for 30 minutes.  Raking leaves for 30 minutes.  Bicycling 5 miles in 30 minutes.  Walking 2 miles in 30 minutes.  Dancing for 30 minutes.  Shoveling snow for 15 minutes.  Swimming laps for 20 minutes.  Walking up stairs for 15 minutes.  Bicycling 4 miles in 15 minutes.  Gardening for 30 to 45 minutes.  Jumping rope for 15 minutes.  Washing windows or floors for 45 to 60 minutes. Document Released: 03/21/2010 Document Revised: 05/11/2011 Document Reviewed: 03/21/2010 ExitCare Patient Information 2013 Exit[employer].   Other topics ( that may be useful information):    Sexually Transmitted Disease Sexually transmitted disease (STD) refers to any infection that is passed from person to person during sexual activity. This may happen by way of saliva, semen, blood, vaginal mucus, or urine. Common STDs include:  Gonorrhea.  Chlamydia.  Syphilis.  HIV/AIDS.  Genital herpes.  Hepatitis B and C.  Trichomonas.  Human papillomavirus (HPV).  Pubic lice. CAUSES  An STD may be spread by bacteria, virus, or parasite. A person can get an STD by:  Sexual intercourse with an infected person.  Sharing sex toys with an infected person.  Sharing needles with an infected person.  Having intimate contact with the genitals, mouth, or rectal areas of an infected person. SYMPTOMS  Some people may not have any symptoms, but   they can still pass the infection to others. Different STDs have different symptoms. Symptoms include:  Painful or bloody urination.  Pain in the pelvis, abdomen, vagina, anus, throat, or eyes.  Skin rash, itching, irritation, growths, or sores (lesions). These usually occur in the genital or anal area.  Abnormal vaginal discharge.  Penile discharge in men.  Soft, flesh-colored skin growths in the  genital or anal area.  Fever.  Pain or bleeding during sexual intercourse.  Swollen glands in the groin area.  Yellow skin and eyes (jaundice). This is seen with hepatitis. DIAGNOSIS  To make a diagnosis, your caregiver may:  Take a medical history.  Perform a physical exam.  Take a specimen (culture) to be examined.  Examine a sample of discharge under a microscope.  Perform blood test TREATMENT   Chlamydia, gonorrhea, trichomonas, and syphilis can be cured with antibiotic medicine.  Genital herpes, hepatitis, and HIV can be treated, but not cured, with prescribed medicines. The medicines will lessen the symptoms.  Genital warts from HPV can be treated with medicine or by freezing, burning (electrocautery), or surgery. Warts may come back.  HPV is a virus and cannot be cured with medicine or surgery.However, abnormal areas may be followed very closely by your caregiver and may be removed from the cervix, vagina, or vulva through office procedures or surgery. If your diagnosis is confirmed, your recent sexual partners need treatment. This is true even if they are symptom-free or have a negative culture or evaluation. They should not have sex until their caregiver says it is okay. HOME CARE INSTRUCTIONS  All sexual partners should be informed, tested, and treated for all STDs.  Take your antibiotics as directed. Finish them even if you start to feel better.  Only take over-the-counter or prescription medicines for pain, discomfort, or fever as directed by your caregiver.  Rest.  Eat a balanced diet and drink enough fluids to keep your urine clear or pale yellow.  Do not have sex until treatment is completed and you have followed up with your caregiver. STDs should be checked after treatment.  Keep all follow-up appointments, Pap tests, and blood tests as directed by your caregiver.  Only use latex condoms and water-soluble lubricants during sexual activity. Do not use  petroleum jelly or oils.  Avoid alcohol and illegal drugs.  Get vaccinated for HPV and hepatitis. If you have not received these vaccines in the past, talk to your caregiver about whether one or both might be right for you.  Avoid risky sex practices that can break the skin. The only way to avoid getting an STD is to avoid all sexual activity.Latex condoms and dental dams (for oral sex) will help lessen the risk of getting an STD, but will not completely eliminate the risk. SEEK MEDICAL CARE IF:   You have a fever.  You have any new or worsening symptoms. Document Released: 05/09/2002 Document Revised: 05/11/2011 Document Reviewed: 05/16/2010 [facility] Patient Information 2013 [employer].    Domestic Abuse You are being battered or abused if someone close to you hits, pushes, or physically hurts you in any way. You also are being abused if you are forced into activities. You are being sexually abused if you are forced to have sexual contact of any kind. You are being emotionally abused if you are made to feel worthless or if you are constantly threatened. It is important to remember that help is available. No one has the right to abuse you. PREVENTION OF FURTHER  ABUSE  Learn the warning signs of danger. This varies with situations but may include: the use of alcohol, threats, isolation from friends and family, or forced sexual contact. Leave if you feel that violence is going to occur.  If you are attacked or beaten, report it to the police so the abuse is documented. You do not have to press charges. The police can protect you while you or the attackers are leaving. Get the officer's name and badge number and a copy of the report.  Find someone you can trust and tell them what is happening to you: your caregiver, a nurse, clergy member, close friend or family member. Feeling ashamed is natural, but remember that you have done nothing wrong. No one deserves abuse. Document Released:  02/14/2000 Document Revised: 05/11/2011 Document Reviewed: 04/24/2010 ExitCare Patient Information 2013 ExitCare[employer].    How Much is Too Much Alcohol? Drinking too much alcohol can cause injury, accidents, and health problems. These types of problems can include:   Car crashes.  Falls.  Family fighting (domestic violence).  Drowning.  Fights.  Injuries.  Burns.  Damage to certain organs.  Having a baby with birth defects. ONE DRINK CAN BE TOO MUCH WHEN YOU ARE:  Working.  Pregnant or breastfeeding.  Taking medicines. Ask your doctor.  Driving or planning to drive. If you or someone you know has a drinking problem, get help from a doctor.  Document Released: 12/13/2008 Document Revised: 05/11/2011 Document Reviewed: 12/13/2008 ExitCare Patient Information 2013 ExitCare[employer].   Smoking Hazards Smoking cigarettes is extremely bad for your health. Tobacco smoke has over 200 known poisons in it. There are over 60 chemicals in tobacco smoke that cause cancer. Some of the chemicals found in cigarette smoke include:   Cyanide.  Benzene.  Formaldehyde.  Methanol (wood alcohol).  Acetylene (fuel used in welding torches).  Ammonia. Cigarette smoke also contains the poisonous gases nitrogen oxide and carbon monoxide.  Cigarette smokers have an increased risk of many serious medical problems and Smoking causes approximately:  90% of all lung cancer deaths in men.  80% of all lung cancer deaths in women.  90% of deaths from chronic obstructive lung disease. Compared with nonsmokers, smoking increases the risk of:  Coronary heart disease by 2 to 4 times.  Stroke by 2 to 4 times.  Men developing lung cancer by 23 times.  Women developing lung cancer by 13 times.  Dying from chronic obstructive lung diseases by 12 times.  . Smoking is the most preventable cause of death and disease in our society.  WHY IS SMOKING ADDICTIVE?  Nicotine is the chemical  agent in tobacco that is capable of causing addiction or dependence.  When you smoke and inhale, nicotine is absorbed rapidly into the bloodstream through your lungs. Nicotine absorbed through the lungs is capable of creating a powerful addiction. Both inhaled and non-inhaled nicotine may be addictive.  Addiction studies of cigarettes and spit tobacco show that addiction to nicotine occurs mainly during the teen years, when young people begin using tobacco products. WHAT ARE THE BENEFITS OF QUITTING?  There are many health benefits to quitting smoking.   Likelihood of developing cancer and heart disease decreases. Health improvements are seen almost immediately.  Blood pressure, pulse rate, and breathing patterns start returning to normal soon after quitting. QUITTING SMOKING   American Lung Association - 1-800-LUNGUSA  American Cancer Society - 1-800-ACS-2345 Document Released: 03/26/2004 Document Revised: 05/11/2011 Document Reviewed: 11/28/2008 ExitCare Patient Information 2013 ExitCare,   LLC.   Stress Management Stress is a state of physical or mental tension that often results from changes in your life or normal routine. Some common causes of stress are:  Death of a loved one.  Injuries or severe illnesses.  Getting fired or changing jobs.  Moving into a new home. Other causes may be:  Sexual problems.  Business or financial losses.  Taking on a large debt.  Regular conflict with someone at home or at work.  Constant tiredness from lack of sleep. It is not just bad things that are stressful. It may be stressful to:  Win the lottery.  Get married.  Buy a new car. The amount of stress that can be easily tolerated varies from person to person. Changes generally cause stress, regardless of the types of change. Too much stress can affect your health. It may lead to physical or emotional problems. Too little stress (boredom) may also become stressful. SUGGESTIONS TO  REDUCE STRESS:  Talk things over with your family and friends. It often is helpful to share your concerns and worries. If you feel your problem is serious, you may want to get help from a professional counselor.  Consider your problems one at a time instead of lumping them all together. Trying to take care of everything at once may seem impossible. List all the things you need to do and then start with the most important one. Set a goal to accomplish 2 or 3 things each day. If you expect to do too many in a single day you will naturally fail, causing you to feel even more stressed.  Do not use alcohol or drugs to relieve stress. Although you may feel better for a short time, they do not remove the problems that caused the stress. They can also be habit forming.  Exercise regularly - at least 3 times per week. Physical exercise can help to relieve that "uptight" feeling and will relax you.  The shortest distance between despair and hope is often a good night's sleep.  Go to bed and get up on time allowing yourself time for appointments without being rushed.  Take a short "time-out" period from any stressful situation that occurs during the day. Close your eyes and take some deep breaths. Starting with the muscles in your face, tense them, hold it for a few seconds, then relax. Repeat this with the muscles in your neck, shoulders, hand, stomach, back and legs.  Take good care of yourself. Eat a balanced diet and get plenty of rest.  Schedule time for having fun. Take a break from your daily routine to relax. HOME CARE INSTRUCTIONS   Call if you feel overwhelmed by your problems and feel you can no longer manage them on your own.  Return immediately if you feel like hurting yourself or someone else. Document Released: 08/12/2000 Document Revised: 05/11/2011 Document Reviewed: 04/04/2007 ExitCare Patient Information 2013 ExitCare, LLC.   

## 2017-04-13 NOTE — Progress Notes (Signed)
22 y.o. G0P0000 Single  Caucasian Fe here for annual exam. Periods regular with Paragard IUD. Partner change, desires STD screening. 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 Anxious and but aware this will be exactly what she wants to do  No health concerns today.  Sexually active: Yes.    The current method of family planning is IUD.    Exercising: No.  exercise Smoker:  marijuana use  Health Maintenance: Pap:  02-19-15 ascus, 03-27-16 neg History of Abnormal Pap: yes MMG:  none Self Breast exams: occ Colonoscopy:  none BMD:   none TDaP:  2014 Shingles: no Pneumonia: no Hep C and HIV: both neg 2016 Labs: yes   reports that  has never smoked. she has never used smokeless tobacco. She reports that she drinks about 1.2 oz of alcohol per week. She reports that she uses drugs. Drug: Marijuana.  Past Medical History:  Diagnosis Date  . Anxiety   . Depression   . History of traumatic brain injury   . IBS (irritable bowel syndrome)    no current med.  . Migraines   . 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 Seasonal allergies     Past Surgical History:  Procedure Laterality Date  . TONSILLECTOMY N/A 11/11/2016   Procedure: TONSILLECTOMY;  Surgeon: Serena Colonelosen, Jefry, MD;  Location: [facility];  Service: ENT;  Laterality: N/A;  . WISDOM TOOTH EXTRACTION      Current Outpatient Medications  Medication Sig Dispense Refill  . amphetamine-dextroamphetamine (ADDERALL) 20 MG tablet Take 1/2 to 1 tablet 1 or 2 x / day for ADD- maximum of 5 days / week to avoid addiction 60 tablet 0  . cetirizine (ZYRTEC) 10 MG tablet Take 10 mg by mouth daily.     No current facility-administered medications for this visit.     Family History  Problem Relation Age of Onset  . Allergies Mother   . Stroke Paternal Grandfather     ROS:  Pertinent items are noted in HPI.  Otherwise, a comprehensive ROS was negative.  Exam:   BP 110/76   Pulse 70    Resp 16   Ht 5' 3.25" (1.607 m)   Wt 131 lb (59.4 kg)   LMP 04/11/2017 (Exact Date)   BMI 23.02 kg/m  Height: 5' 3.25" (160.7 cm) Ht Readings from Last 3 Encounters:  04/13/17 5' 3.25" (1.607 m)  02/15/17 5' 4.5" (1.638 m)  12/06/16 5\' 3"  (1.6 m)    General appearance: alert, cooperative and appears stated age Head: Normocephalic, without obvious abnormality, atraumatic Neck: no adenopathy, supple, symmetrical, trachea midline and thyroid normal to inspection and palpation Lungs: clear to auscultation bilaterally Breasts: normal appearance, no masses or tenderness, No nipple retraction or dimpling, No nipple discharge or bleeding, No axillary or supraclavicular adenopathy Heart: regular rate and rhythm Abdomen: soft, non-tender; no masses,  no organomegaly Extremities: extremities normal, atraumatic, no cyanosis or edema Skin: Skin color, texture, turgor normal. No rashes or lesions Lymph nodes: Cervical, supraclavicular, and axillary nodes normal. No abnormal inguinal nodes palpated Neurologic: Grossly normal   Pelvic: External genitalia:  no lesions              Urethra:  normal appearing urethra with no masses, tenderness or lesions              Bartholin's and Skene's: normal                 Vagina: normal  appearing vagina with normal color and discharge, no lesions              Cervix: no cervical motion tenderness, no lesions, nulliparous appearance and IUD string present in cervix              Pap taken: No. 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

## 2017-04-14 LAB — CHLAMYDIA/GC NAA, CONFIRMATION
CHLAMYDIA TRACHOMATIS, NAA: NEGATIVE
NEISSERIA GONORRHOEAE, NAA: NEGATIVE

## 2017-04-14 LAB — VAGINITIS/VAGINOSIS, DNA PROBE
CANDIDA SPECIES: NEGATIVE
GARDNERELLA VAGINALIS: NEGATIVE
Trichomonas vaginosis: NEGATIVE

## 2017-04-19 ENCOUNTER — Other Ambulatory Visit: Payer: Self-pay | Admitting: Internal Medicine

## 2017-04-19 DIAGNOSIS — F988 Other specified behavioral and emotional disorders with onset usually occurring in childhood and adolescence: Secondary | ICD-10-CM

## 2017-04-19 MED ORDER — AMPHETAMINE-DEXTROAMPHETAMINE 20 MG PO TABS: ORAL_TABLET | ORAL | 0 refills | Status: DC

## 2017-04-26 ENCOUNTER — Ambulatory Visit: Payer: Self-pay | Admitting: Physician Assistant

## 2017-07-29 ENCOUNTER — Ambulatory Visit (INDEPENDENT_AMBULATORY_CARE_PROVIDER_SITE_OTHER): Payer: Managed Care, Other (non HMO) | Admitting: *Deleted

## 2017-07-29 DIAGNOSIS — Z111 Encounter for screening for respiratory tuberculosis: Secondary | ICD-10-CM

## 2017-08-03 LAB — TB SKIN TEST
Induration: 0 mm
TB Skin Test: NEGATIVE

## 2017-10-18 ENCOUNTER — Other Ambulatory Visit: Payer: Self-pay | Admitting: Internal Medicine

## 2017-10-18 DIAGNOSIS — F988 Other specified behavioral and emotional disorders with onset usually occurring in childhood and adolescence: Secondary | ICD-10-CM

## 2017-10-18 MED ORDER — AMPHETAMINE-DEXTROAMPHETAMINE 20 MG PO TABS: ORAL_TABLET | ORAL | 0 refills | Status: DC

## 2018-03-04 ENCOUNTER — Other Ambulatory Visit: Payer: Self-pay | Admitting: Internal Medicine

## 2018-03-04 DIAGNOSIS — F988 Other specified behavioral and emotional disorders with onset usually occurring in childhood and adolescence: Secondary | ICD-10-CM

## 2018-03-22 ENCOUNTER — Encounter: Payer: Self-pay | Admitting: Adult Health Nurse Practitioner

## 2018-03-22 ENCOUNTER — Ambulatory Visit (INDEPENDENT_AMBULATORY_CARE_PROVIDER_SITE_OTHER): Payer: Managed Care, Other (non HMO) | Admitting: Adult Health Nurse Practitioner

## 2018-03-22 VITALS — BP 118/66 | HR 96 | Temp 97.8°F | Ht 63.25 in | Wt 130.8 lb

## 2018-03-22 DIAGNOSIS — F988 Other specified behavioral and emotional disorders with onset usually occurring in childhood and adolescence: Secondary | ICD-10-CM

## 2018-03-22 DIAGNOSIS — F5105 Insomnia due to other mental disorder: Secondary | ICD-10-CM | POA: Diagnosis not present

## 2018-03-22 DIAGNOSIS — F409 Phobic anxiety disorder, unspecified: Secondary | ICD-10-CM | POA: Diagnosis not present

## 2018-03-22 DIAGNOSIS — F411 Generalized anxiety disorder: Secondary | ICD-10-CM | POA: Diagnosis not present

## 2018-03-22 MED ORDER — AMPHETAMINE-DEXTROAMPHETAMINE 20 MG PO TABS: ORAL_TABLET | ORAL | 0 refills | Status: DC

## 2018-03-22 NOTE — Patient Instructions (Signed)
Try OTC Melatonin 10mg  nightly to help you sleep.  Continue the healthy habits of exercise.  We will send in her Adderall 20mg  tablets twice a day.  We discussed options for long term management of your anxiety.  We will follow up next week.  We discussed group therapy sessions as an alternative for you.  We will discuss this more next visit.  The [employer] number is [phone].  Call this number ANYTIME!
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
## 2018-03-23 ENCOUNTER — Encounter: Payer: Self-pay | Admitting: Adult Health Nurse Practitioner

## 2018-03-23 DIAGNOSIS — F411 Generalized anxiety disorder: Secondary | ICD-10-CM | POA: Insufficient documentation

## 2018-03-23 DIAGNOSIS — F5105 Insomnia due to other mental disorder: Secondary | ICD-10-CM

## 2018-03-23 DIAGNOSIS — F409 Phobic anxiety disorder, unspecified: Secondary | ICD-10-CM | POA: Insufficient documentation

## 2018-03-29 ENCOUNTER — Ambulatory Visit: Payer: Self-pay | Admitting: Adult Health Nurse Practitioner

## 2018-04-05 ENCOUNTER — Ambulatory Visit: Payer: Self-pay | Admitting: Adult Health Nurse Practitioner

## 2018-04-18 ENCOUNTER — Ambulatory Visit (INDEPENDENT_AMBULATORY_CARE_PROVIDER_SITE_OTHER): Payer: Managed Care, Other (non HMO) | Admitting: Adult Health

## 2018-04-18 ENCOUNTER — Ambulatory Visit
Admission: RE | Admit: 2018-04-18 | Discharge: 2018-04-18 | Disposition: A | Discharge status: Home / Self Care | Payer: Managed Care, Other (non HMO) | Source: Ambulatory Visit | Attending: Adult Health | Admitting: Adult Health

## 2018-04-18 ENCOUNTER — Encounter: Payer: Self-pay | Admitting: Adult Health

## 2018-04-18 VITALS — BP 120/82 | HR 71 | Temp 97.9°F | Ht 63.25 in | Wt 130.0 lb

## 2018-04-18 DIAGNOSIS — R51 Headache: Principal | ICD-10-CM

## 2018-04-18 DIAGNOSIS — R4586 Emotional lability: Secondary | ICD-10-CM

## 2018-04-18 DIAGNOSIS — R519 Headache, unspecified: Secondary | ICD-10-CM

## 2018-04-18 DIAGNOSIS — F411 Generalized anxiety disorder: Secondary | ICD-10-CM | POA: Diagnosis not present

## 2018-04-18 MED ORDER — ALPRAZOLAM 0.5 MG PO TABS: ORAL_TABLET | ORAL | 0 refills | Status: DC
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
## 2018-04-18 NOTE — Patient Instructions (Addendum)
Use xanax as needed, don't take with alcohol   Please schedule to see psychiatry, and I think you could benefit from CBT (cognitive behavioral therapy) with a therapist -   You can set these up by checking the back of your insurance card  Panic Attack A panic attack is a sudden episode of severe anxiety, fear, or discomfort that causes physical and emotional symptoms. The attack may be in response to something frightening, or it may occur for no known reason. Symptoms of a panic attack can be similar to symptoms of a heart attack or stroke. It is important to see your health care provider when you have a panic attack so that these conditions can be ruled out. A panic attack is a symptom of another condition. Most panic attacks go away with treatment of the underlying problem. If you have panic attacks often, you may have a condition called panic disorder. What are the causes? A panic attack may be caused by:  An extreme, life-threatening situation, such as a war or natural disaster.  An anxiety disorder, such as post-traumatic stress disorder.  Depression.  Certain medical conditions, including heart problems, neurological conditions, and infections.  Certain over-the-counter and prescription medicines.  Illegal drugs that increase heart rate and blood pressure, such as methamphetamine.  Alcohol.  Supplements that increase anxiety.  Panic disorder. What increases the risk? You are more likely to develop this condition if:  You have an anxiety disorder.  You have another mental health condition.  You take certain medicines.  You use alcohol, illegal drugs, or other substances.  You are under extreme stress.  A life event is causing increased feelings of anxiety and depression. What are the signs or symptoms? A panic attack starts suddenly, usually lasts about 20 minutes, and occurs with one or more of the following:  A pounding heart.  A feeling that your heart is  beating irregularly or faster than normal (palpitations).  Sweating.  Trembling or shaking.  Shortness of breath or feeling smothered.  Feeling choked.  Chest pain or discomfort.  Nausea or a strange feeling in your stomach.  Dizziness, feeling lightheaded, or feeling like you might faint.  Chills or hot flashes.  Numbness or tingling in your lips, hands, or feet.  Feeling confused, or feeling that you are not yourself.  Fear of losing control or being emotionally unstable.  Fear of dying. How is this diagnosed? A panic attack is diagnosed with an assessment by your health care provider. During the assessment your health care provider will ask questions about:  Your history of anxiety, depression, and panic attacks.  Your medical history.  Whether you drink alcohol, use illegal drugs, take supplements, or take medicines. Be honest about your substance use. Your health care provider may also:  Order blood tests or other kinds of tests to rule out serious medical conditions.  Refer you to a mental health professional for further evaluation. How is this treated? Treatment depends on the cause of the panic attack:  If the cause is a medical problem, your health care provider will either treat that problem or refer you to a specialist.  If the cause is emotional, you may be given anti-anxiety medicines or referred to a counselor. These medicines may reduce how often attacks happen, reduce how severe the attacks are, and lower anxiety.  If the cause is a medicine, your health care provider may tell you to stop the medicine, change your dose, or take a different medicine.  If the cause is a drug, treatment may involve letting the drug wear off and taking medicine to help the drug leave your body or to counteract its effects. Attacks caused by drug abuse may continue even if you stop using the drug. Follow these instructions at home:  Take over-the-counter and prescription  medicines only as told by your health care provider.  If you feel anxious, limit your caffeine intake.  Take good care of your physical and mental health by: ? Eating a balanced diet that includes plenty of fresh fruits and vegetables, whole grains, lean meats, and low-fat dairy. ? Getting plenty of rest. Try to get 7-8 hours of uninterrupted sleep each night. ? Exercising regularly. Try to get 30 minutes of physical activity at least 5 days a week. ? Not smoking. Talk to your health care provider if you need help quitting. ? Limiting alcohol intake to no more than 1 drink a day for nonpregnant women and 2 drinks a day for men. One drink equals 12 oz of beer, 5 oz of wine, or 1 oz of hard liquor.  Keep all follow-up visits as told by your health care provider. This is important. Panic attacks may have underlying physical or emotional problems that take time to accurately diagnose. Contact a health care provider if:  Your symptoms do not improve, or they get worse.  You are not able to take your medicine as prescribed because of side effects. Get help right away if:  You have serious thoughts about hurting yourself or others.  You have symptoms of a panic attack. Do not drive yourself to the hospital. Have someone else drive you or call an ambulance. If you ever feel like you may hurt yourself or others, or you have thoughts about taking your own life, get help right away. You can go to your nearest emergency department or call:  Your local emergency services (911 in the [place]  A suicide crisis helpline, such as the National Suicide Prevention Lifeline at 8288594642. This is open 24 hours a day. Summary  A panic attack is a sign of a serious health or mental health condition. Get help right away. Do not drive yourself to the hospital. Have someone else drive you or call an ambulance.  Always see a health care provider to have the reasons for the panic attack correctly  diagnosed.  If your panic attack was caused by a physical problem, follow your health care provider's suggestions for medicine, referral to a specialist, and lifestyle changes.  If your panic attack was caused by an emotional problem, follow through with counseling from a qualified mental health specialist.  If you feel like you may hurt yourself or others, call 911 and get help right away. This information is not intended to replace advice given to you by your health care provider. Make sure you discuss any questions you have with your health care provider. Document Released: 02/16/2005 Document Revised: 03/27/2016 Document Reviewed: 03/27/2016 Elsevier Interactive Patient Education  2019 Else[employer]      Mania Mania is a condition that affects people who have certain mood disorders. Mania involves episodes of emotional highs that include having very high energy, racing thoughts, very high self-esteem, and decreased ability to concentrate. These episodes are very intense and can last longer than a week. In some cases, episodes of mania can be so strong that people with this condition need to be hospitalized for their safety and the safety of people around them. What are  the causes? The cause of this condition is not known. What increases the risk? You are more likely to develop mania if you have a mood disorder, especially bipolar disorder. If you have a mood disorder, the following factors may increase your risk of developing mania:  Not getting enough sleep.  Using substances such as tobacco, caffeine, or illegal drugs.  Certain prescription medicines, such as antidepressants or antibiotics.  Stress or emotional events.  Certain seasons. Mania is more common in spring and summer.  The period of time after having a baby (postpartum period). What are the signs or symptoms? Symptoms of this condition include:  Periods of having very high energy that may last longer than a  week. In some cases, you have so much energy that you may become unsafe and need to go to the hospital.  Very high self-esteem or self-confidence.  Decreased need for sleep.  Being unusually talkative, or feeling a need to keep talking. Speech may be very fast. It may seem like you cannot stop talking.  Racing thoughts or constant talking, with quick shifts between topics that may or may not be related (flight of ideas).  Decreased ability to focus or concentrate.  Increased purposeful activity, such as work, study, or social activity.  Increased nonproductive activity. This could be pacing, squirming and fidgeting, or finger and toe tapping.  Impulsive behavior and poor judgment. This may result in high-risk activities, such as having unprotected sex or spending a lot of money.  Having false beliefs (delusions) or seeing, hearing, or feeling things that do not exist (hallucinations). How is this diagnosed? This condition may be diagnosed based on:  Your symptoms and medical history.  A physical exam. Your health care provider will check for physical conditions that may be causing your symptoms.  A mental health evaluation. You may be referred to a mental health provider who specializes in diagnosing and treating mood disorders. How is this treated? This condition may be treated with:  Medicines, such as mood stabilizers.  Talk therapy (psychotherapy) with a mental health provider.  A procedure to change the brain chemicals that send messages between brain cells (neurotransmitters). This procedure, called electroconvulsive therapy (ECT), applies short electrical pulses to the brain through the scalp. This may be used in cases of severe mania when other treatments have not helped. Follow these instructions at home:  Take over-the-counter and prescription medicines only as told by your health care provider.  Try to go to sleep and wake up at the same time every day.  Make and  follow a routine for daily meal times.  Ask for support from family, friends, or relatives to make sure you stay on track with your treatment.  Keep all follow-up visits as told by your health care provider. This is important. Contact a health care provider if:  You have concerns about your treatment.  You have side effects from your prescription medicines.  Your symptoms do not improve or they get worse.  Your mania may be putting your health, or others' health, at risk. Get help right away if:  You think about hurting yourself or you try to hurt yourself.  You think about suicide. If you ever feel like you may hurt yourself or others, or have thoughts about taking your own life, get help right away. You can go to your nearest emergency department or call:  Your local emergency services (911 in the [place]  A suicide crisis helpline, such as the National Suicide Prevention  Lifeline at 226 370 5197. This is open 24 hours a day. Summary  Mania involves episodes of emotional highs that include having very high energy, racing thoughts, very high self-esteem, and decreased ability to concentrate.  Episodes of mania are very intense and can last longer than a week.  Treatment for mania may include medicines and talk therapy (psychotherapy). This information is not intended to replace advice given to you by your health care provider. Make sure you discuss any questions you have with your health care provider. Document Released: 06/12/2016 Document Revised: 06/12/2016 Document Reviewed: 06/12/2016 Elsevier Interactive Patient Education  2019 Elsevier Inc.   Alprazolam tablets What is this medicine? ALPRAZOLAM (al PRAY zoe lam) is a benzodiazepine. It is used to treat anxiety and panic attacks. This medicine may be used for other purposes; ask your health care provider or pharmacist if you have questions. COMMON BRAND NAME(S): Xanax What should I tell my health care provider before  I take this medicine? They need to know if you have any of these conditions: -an alcohol or drug abuse problem -bipolar disorder, depression, psychosis or other mental health conditions -glaucoma -kidney or liver disease -lung or breathing disease -myasthenia gravis -Parkinson's disease -porphyria -seizures or a history of seizures -suicidal thoughts -an unusual or allergic reaction to alprazolam, other benzodiazepines, foods, dyes, or preservatives -pregnant or trying to get pregnant -breast-feeding How should I use this medicine? Take this medicine by mouth with a glass of water. Follow the directions on the prescription label. Take your medicine at regular intervals. Do not take it more often than directed. Do not stop taking except on your doctor's advice. A special MedGuide will be given to you by the pharmacist with each prescription and refill. Be sure to read this information carefully each time. Talk to your pediatrician regarding the use of this medicine in children. Special care may be needed. Overdosage: If you think you have taken too much of this medicine contact a poison control center or emergency room at once. NOTE: This medicine is only for you. Do not share this medicine with others. What if I miss a dose? If you miss a dose, take it as soon as you can. If it is almost time for your next dose, take only that dose. Do not take double or extra doses. What may interact with this medicine? Do not take this medicine with any of the following medications: -certain antiviral medicines for HIV or AIDS like delavirdine, indinavir -certain medicines for fungal infections like ketoconazole and itraconazole -narcotic medicines for cough -sodium oxybate This medicine may also interact with the following medications: -alcohol -antihistamines for allergy, cough and cold -certain antibiotics like clarithromycin, erythromycin, isoniazid, rifampin, rifapentine, rifabutin, and  troleandomycin -certain medicines for blood pressure, heart disease, irregular heart beat -certain medicines for depression, like amitriptyline, fluoxetine, sertraline -certain medicines for seizures like carbamazepine, oxcarbazepine, phenobarbital, phenytoin, primidone -cimetidine -cyclosporine -female hormones, like estrogens or progestins and birth control pills, patches, rings, or injections -general anesthetics like halothane, isoflurane, methoxyflurane, propofol -grapefruit juice -local anesthetics like lidocaine, pramoxine, tetracaine -medicines that relax muscles for surgery -narcotic medicines for pain -other antiviral medicines for HIV or AIDS -phenothiazines like chlorpromazine, mesoridazine, prochlorperazine, thioridazine This list may not describe all possible interactions. Give your health care provider a list of all the medicines, herbs, non-prescription drugs, or dietary supplements you use. Also tell them if you smoke, drink alcohol, or use illegal drugs. Some items may interact with your medicine. What should I watch for  while using this medicine? Tell your doctor or health care professional if your symptoms do not start to get better or if they get worse. Do not stop taking except on your doctor's advice. You may develop a severe reaction. Your doctor will tell you how much medicine to take. You may get drowsy or dizzy. Do not drive, use machinery, or do anything that needs mental alertness until you know how this medicine affects you. To reduce the risk of dizzy and fainting spells, do not stand or sit up quickly, especially if you are an older patient. Alcohol may increase dizziness and drowsiness. Avoid alcoholic drinks. If you are taking another medicine that also causes drowsiness, you may have more side effects. Give your health care provider a list of all medicines you use. Your doctor will tell you how much medicine to take. Do not take more medicine than directed. Call  emergency for help if you have problems breathing or unusual sleepiness. What side effects may I notice from receiving this medicine? Side effects that you should report to your doctor or health care professional as soon as possible: -allergic reactions like skin rash, itching or hives, swelling of the face, lips, or tongue -breathing problems -confusion -loss of balance or coordination -signs and symptoms of low blood pressure like dizziness; feeling faint or lightheaded, falls; unusually weak or tired -suicidal thoughts or other mood changes Side effects that usually do not require medical attention (report to your doctor or health care professional if they continue or are bothersome): -dizziness -dry mouth -nausea, vomiting -tiredness This list may not describe all possible side effects. Call your doctor for medical advice about side effects. You may report side effects to FDA at 1-800-FDA-1088. Where should I keep my medicine? Keep out of the reach of children. This medicine can be abused. Keep your medicine in a safe place to protect it from theft. Do not share this medicine with anyone. Selling or giving away this medicine is dangerous and against the law. Store at room temperature between 20 and 25 degrees C (68 and 77 degrees F). This medicine may cause accidental overdose and death if taken by other adults, children, or pets. Mix any unused medicine with a substance like cat litter or coffee grounds. Then throw the medicine away in a sealed container like a sealed bag or a coffee can with a lid. Do not use the medicine after the expiration date. NOTE: This sheet is a summary. It may not cover all possible information. If you have questions about this medicine, talk to your doctor, pharmacist, or health care provider.  2019 Elsevier/Gold Standard (2014-11-15 13:47:25)
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
## 2018-05-24 ENCOUNTER — Ambulatory Visit: Payer: Self-pay | Admitting: Adult Health
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
## 2018-06-02 ENCOUNTER — Encounter: Payer: Self-pay | Admitting: Adult Health

## 2018-06-02 ENCOUNTER — Other Ambulatory Visit: Payer: Self-pay

## 2018-06-02 ENCOUNTER — Ambulatory Visit: Payer: Managed Care, Other (non HMO) | Admitting: Adult Health

## 2018-06-02 DIAGNOSIS — R4586 Emotional lability: Secondary | ICD-10-CM

## 2018-06-02 DIAGNOSIS — F988 Other specified behavioral and emotional disorders with onset usually occurring in childhood and adolescence: Secondary | ICD-10-CM

## 2018-06-02 DIAGNOSIS — F411 Generalized anxiety disorder: Secondary | ICD-10-CM

## 2018-06-02 DIAGNOSIS — F32A Depression, unspecified: Secondary | ICD-10-CM

## 2018-06-02 DIAGNOSIS — F329 Major depressive disorder, single episode, unspecified: Secondary | ICD-10-CM

## 2018-06-02 MED ORDER — AMPHETAMINE-DEXTROAMPHETAMINE 20 MG PO TABS: ORAL_TABLET | ORAL | 0 refills | Status: DC

## 2018-06-02 MED ORDER — LAMOTRIGINE 25 MG PO TABS: ORAL_TABLET | ORAL | 1 refills | Status: DC

## 2018-06-02 MED ORDER — ALPRAZOLAM 0.5 MG PO TABS: ORAL_TABLET | ORAL | 0 refills | Status: DC

## 2018-06-16 ENCOUNTER — Ambulatory Visit: Payer: Managed Care, Other (non HMO) | Admitting: Adult Health

## 2018-06-16 ENCOUNTER — Other Ambulatory Visit: Payer: Self-pay

## 2018-06-16 ENCOUNTER — Encounter: Payer: Self-pay | Admitting: Adult Health

## 2018-06-16 DIAGNOSIS — N390 Urinary tract infection, site not specified: Secondary | ICD-10-CM | POA: Diagnosis not present

## 2018-06-16 MED ORDER — CIPROFLOXACIN HCL 500 MG PO TABS: 500.0000 mg | ORAL_TABLET | Freq: Two times a day (BID) | ORAL | 0 refills | Status: AC

## 2018-06-16 NOTE — Progress Notes (Signed)
Virtual Visit via Telephone Note  I connected with Hannah Reeves on 06/16/18 at 10:45 AM EDT by telephone and verified that I am speaking with the correct person using two identifiers.   I discussed the limitations, risks, security and privacy concerns of performing an evaluation and management service by telephone and the availability of in person appointments. I also discussed with the patient that there may be a patient responsible charge related to this service. The patient expressed understanding and agreed to proceed.   History of Present Illness:  23 y.o. female reports frequency, urgency, dysuria, pelvic pressure x 2 days, worsening overnight. She denies changes in urine character. Denies vaginal discharge. Reports hx of UTI, feels this is very similar symptoms. Denies fever/chills, flank pain, N/V, diarrhea, constipation. Denies hematuria.  Last UTI was last summer, unsure of last agent. Allergic to sulpha and penicillins. She has tolerated cipro in the past.    Observations/Objective:  General : Well sounding patient in no apparent distress HEENT: no hoarseness, no cough for duration of visit Lungs: speaks in complete sentences, no audible wheezing, no apparent distress Neurological: alert, oriented x 3 Psychiatric: pleasant, judgement appropriate    Assessment and Plan:  Bronwen was seen today for dysuria and urinary frequency.  Diagnoses and all orders for this visit:  Urinary tract infection without hematuria, site unspecified Medications: ciprofloxacin. Maintain adequate hydration. Follow up if symptoms not improving, and as needed. -     ciprofloxacin (CIPRO) 500 MG tablet; Take 1 tablet (500 mg total) by mouth 2 (two) times daily for 5 days.   Follow Up Instructions:    I discussed the assessment and treatment plan with the patient. The patient was provided an opportunity to ask questions and all were answered. The patient agreed with the plan and demonstrated  an understanding of the instructions.   The patient was advised to call back or seek an in-person evaluation if the symptoms worsen or if the condition fails to improve as anticipated.  I provided 15 minutes of non-face-to-face time during this encounter.   Dan Maker, NP

## 2018-06-25 ENCOUNTER — Other Ambulatory Visit: Payer: Self-pay | Admitting: Adult Health
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
## 2018-07-04 ENCOUNTER — Other Ambulatory Visit: Payer: Self-pay

## 2018-07-04 ENCOUNTER — Encounter: Payer: Self-pay | Admitting: Adult Health

## 2018-07-04 ENCOUNTER — Ambulatory Visit: Payer: Managed Care, Other (non HMO) | Admitting: Adult Health

## 2018-07-04 DIAGNOSIS — F32A Depression, unspecified: Secondary | ICD-10-CM

## 2018-07-04 DIAGNOSIS — F409 Phobic anxiety disorder, unspecified: Secondary | ICD-10-CM

## 2018-07-04 DIAGNOSIS — F329 Major depressive disorder, single episode, unspecified: Secondary | ICD-10-CM | POA: Diagnosis not present

## 2018-07-04 DIAGNOSIS — F411 Generalized anxiety disorder: Secondary | ICD-10-CM | POA: Diagnosis not present

## 2018-07-04 DIAGNOSIS — F988 Other specified behavioral and emotional disorders with onset usually occurring in childhood and adolescence: Secondary | ICD-10-CM | POA: Diagnosis not present

## 2018-07-04 DIAGNOSIS — F5105 Insomnia due to other mental disorder: Secondary | ICD-10-CM

## 2018-07-04 MED ORDER — LAMOTRIGINE 100 MG PO TABS: ORAL_TABLET | ORAL | 1 refills | Status: DC

## 2018-07-19 ENCOUNTER — Other Ambulatory Visit: Payer: Self-pay

## 2018-07-20 ENCOUNTER — Ambulatory Visit (INDEPENDENT_AMBULATORY_CARE_PROVIDER_SITE_OTHER): Payer: Managed Care, Other (non HMO) | Admitting: Obstetrics and Gynecology

## 2018-07-20 ENCOUNTER — Ambulatory Visit: Payer: Managed Care, Other (non HMO) | Admitting: Obstetrics and Gynecology

## 2018-07-20 ENCOUNTER — Other Ambulatory Visit: Payer: Self-pay | Admitting: Adult Health

## 2018-07-20 ENCOUNTER — Other Ambulatory Visit (HOSPITAL_COMMUNITY)
Admission: RE | Admit: 2018-07-20 | Discharge: 2018-07-20 | Disposition: A | Discharge status: Home / Self Care | Payer: Managed Care, Other (non HMO) | Source: Ambulatory Visit | Attending: Obstetrics and Gynecology | Admitting: Obstetrics and Gynecology

## 2018-07-20 ENCOUNTER — Encounter: Payer: Self-pay | Admitting: Obstetrics and Gynecology

## 2018-07-20 VITALS — BP 110/70 | HR 92 | Temp 97.9°F | Resp 14 | Ht 62.75 in | Wt 129.0 lb

## 2018-07-20 DIAGNOSIS — Z113 Encounter for screening for infections with a predominantly sexual mode of transmission: Secondary | ICD-10-CM | POA: Insufficient documentation

## 2018-07-20 DIAGNOSIS — Z01419 Encounter for gynecological examination (general) (routine) without abnormal findings: Secondary | ICD-10-CM | POA: Diagnosis present
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
## 2018-07-20 NOTE — Patient Instructions (Signed)

## 2018-07-21 LAB — CYTOLOGY - PAP
Chlamydia: NEGATIVE
Diagnosis: NEGATIVE
Neisseria Gonorrhea: NEGATIVE
Trichomonas: NEGATIVE

## 2018-07-21 LAB — HEP, RPR, HIV PANEL
HIV Screen 4th Generation wRfx: NONREACTIVE
Hepatitis B Surface Ag: NEGATIVE
RPR Ser Ql: NONREACTIVE

## 2018-07-21 LAB — HEPATITIS C ANTIBODY: Hep C Virus Ab: <0.1 s/co ratio (ref 0.0–0.9)

## 2018-07-29 ENCOUNTER — Other Ambulatory Visit: Payer: Self-pay | Admitting: Internal Medicine

## 2018-07-29 ENCOUNTER — Other Ambulatory Visit: Payer: Self-pay | Admitting: Adult Health
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
## 2018-08-08 ENCOUNTER — Encounter: Payer: Self-pay | Admitting: Adult Health

## 2018-08-08 ENCOUNTER — Ambulatory Visit: Payer: Managed Care, Other (non HMO) | Admitting: Adult Health

## 2018-08-08 ENCOUNTER — Other Ambulatory Visit: Payer: Self-pay

## 2018-08-08 VITALS — HR 95

## 2018-08-08 DIAGNOSIS — F988 Other specified behavioral and emotional disorders with onset usually occurring in childhood and adolescence: Secondary | ICD-10-CM | POA: Diagnosis not present

## 2018-08-08 DIAGNOSIS — F32A Depression, unspecified: Secondary | ICD-10-CM

## 2018-08-08 DIAGNOSIS — F329 Major depressive disorder, single episode, unspecified: Secondary | ICD-10-CM

## 2018-08-08 DIAGNOSIS — R4586 Emotional lability: Secondary | ICD-10-CM | POA: Diagnosis not present

## 2018-08-08 DIAGNOSIS — F411 Generalized anxiety disorder: Secondary | ICD-10-CM | POA: Diagnosis not present

## 2018-08-08 MED ORDER — AMPHETAMINE-DEXTROAMPHETAMINE 20 MG PO TABS: ORAL_TABLET | ORAL | 0 refills | Status: DC

## 2018-08-27 ENCOUNTER — Other Ambulatory Visit: Payer: Self-pay | Admitting: Internal Medicine

## 2018-09-08 ENCOUNTER — Other Ambulatory Visit: Payer: Self-pay | Admitting: Adult Health

## 2018-11-01 ENCOUNTER — Other Ambulatory Visit: Payer: Self-pay | Admitting: Adult Health

## 2018-11-09 ENCOUNTER — Ambulatory Visit: Payer: Self-pay | Admitting: Adult Health

## 2018-11-20 ENCOUNTER — Other Ambulatory Visit: Payer: Self-pay | Admitting: Internal Medicine

## 2018-12-14 ENCOUNTER — Other Ambulatory Visit: Payer: Self-pay | Admitting: Adult Health

## 2019-01-18 ENCOUNTER — Other Ambulatory Visit: Payer: Self-pay

## 2019-01-18 ENCOUNTER — Encounter (INDEPENDENT_AMBULATORY_CARE_PROVIDER_SITE_OTHER): Payer: Managed Care, Other (non HMO)

## 2019-01-18 DIAGNOSIS — N390 Urinary tract infection, site not specified: Secondary | ICD-10-CM | POA: Diagnosis not present

## 2019-01-18 DIAGNOSIS — F988 Other specified behavioral and emotional disorders with onset usually occurring in childhood and adolescence: Secondary | ICD-10-CM

## 2019-01-18 DIAGNOSIS — R300 Dysuria: Secondary | ICD-10-CM

## 2019-01-19 MED ORDER — NITROFURANTOIN MONOHYD MACRO 100 MG PO CAPS: 100.0000 mg | ORAL_CAPSULE | Freq: Two times a day (BID) | ORAL | 0 refills | Status: DC

## 2019-01-19 MED ORDER — AMPHETAMINE-DEXTROAMPHETAMINE 20 MG PO TABS: ORAL_TABLET | ORAL | 0 refills | Status: DC

## 2019-01-19 NOTE — Telephone Encounter (Signed)
Virtual Visit via Telephone Note  I connected with Hannah Reeves on 01/19/2019 at 1315 by telephone and verified that I am speaking with the correct person using two identifiers.  Location: Patient: home Provider: [place] office    I discussed the limitations, risks, security and privacy concerns of performing an evaluation and management service by telephone and the availability of in person appointments. I also discussed with the patient that there may be a patient responsible charge related to this service. The patient expressed understanding and agreed to proceed.   History of Present Illness:  23 y.o. female reports 2-3 days of dysuria, frequency, now awaking at night due to bladder pain. She reports cloudy urine appearance, foul odor. Denies abdominal pain, back pain, n/v, fever/chills. She reports hx of UTIs since a child.   Denies vaginal discharge or perineal discomfort. Is sexually active with regular female partner for 1.5, has had neg STI screen since that time.    She has been pushing fluid intake. Last UTI she believes was early spring.    Allergies:  Allergies  Allergen Reactions  . Peanut-Containing Drug Products Shortness Of Breath and Itching    TREE NUTS - ITCHING OF MOUTH   . Shellfish Allergy Shortness Of Breath and Itching    ITCHING OF MOUTH  . Penicillins Hives  . Sulfa Antibiotics Hives   Medical History:  has Asthma; Migraines; IBS (irritable bowel syndrome); H/O cold sores; ADD (attention deficit disorder); Anxiety state; Depression; Migraine without aura; S/P tonsillectomy; Recurrent streptococcal tonsillitis; Anxiety, generalized; and Insomnia due to anxiety and fear on their problem list. Surgical History:  She  has a past surgical history that includes Wisdom tooth extraction; Tonsillectomy (N/A, 11/11/2016); and Intrauterine device (iud) insertion. Family History:  Herfamily history includes Allergies in her mother; Stroke in her paternal  grandfather. Social History:   reports that she has never smoked. She has never used smokeless tobacco. She reports current alcohol use of about 2.0 standard drinks of alcohol per week. She reports previous drug use. Drug: Marijuana.    Observations/Objective:  General : Well sounding patient in no apparent distress HEENT: no hoarseness, no cough for duration of visit Lungs: speaks in complete sentences, no audible wheezing, no apparent distress Neurological: alert, oriented x 3 Psychiatric: pleasant, judgement appropriate    Assessment and Plan:  Diagnoses and all orders for this visit:  UTI without hematuria Presumptive treatment for UTI with classic symptoms of simple cystitis Medications: nitrofurantoin. Maintain adequate hydration. Follow up if symptoms not improving, and as needed - pushed out her follow up to 12/3, will check UA/culture at that time if any symptoms -     nitrofurantoin, macrocrystal-monohydrate, (MACROBID) 100 MG capsule; Take 1 capsule (100 mg total) by mouth 2 (two) times daily.  Follow Up Instructions:    I discussed the assessment and treatment plan with the patient. The patient was provided an opportunity to ask questions and all were answered. The patient agreed with the plan and demonstrated an understanding of the instructions.   The patient was advised to call back or seek an in-person evaluation if the symptoms worsen or if the condition fails to improve as anticipated.   I provided 15 minutes of non-face-to-face time during this encounter.   Izora Ribas, NP

## 2019-01-23 ENCOUNTER — Ambulatory Visit: Payer: Managed Care, Other (non HMO) | Admitting: Adult Health

## 2019-02-01 NOTE — Progress Notes (Signed)
6 month follow up  Assessment and Plan:   Bipolar depression type 2/anxiety/ADD Depression with hypomanic episdoes, worse with zoloft  Have recommended psych eval but postponed due to patient preference with pandemic Labile moods much improved with lamotrigine - continue 200 mg daily  PHQ 9 much improved 17 >6 GAD 7 of 11 today  Reviewed that this medication will NOT help with anxiety; overall seems to be improving with lifestyle modification, improved sleep and counseling. Xanax ~3 tabs per week is working well for her though interested in buspar; information provided Benadryl is working well for her in the evenings for sleep.  she prefers to continue with current adderall for ADD despite discussion that this may be aggravating anxiety. Limit use and lowest dose necessary. Taking occasionally as needed for work, <5 days/week -     lamoTRIgine (LAMICTAL) 100 MG tablet; Take 2 tablet (200 mg total) by mouth daily -     ALPRAZolam (XANAX) 0.5 MG tablet; Take 1/2-1 tab of xanax up to 3 times a day as needed for panic attacks or severe anxiety. -     amphetamine-dextroamphetamine (ADDERALL) 20 MG tablet; Take 1/2-1 tablet 1-2 x /day for ADD- max 5 days /week to avoid addiction  Migraines Recently fairly controlled with lifestyle, PRN OTC analgesic  Lifestyle reviewed  Further disposition pending results of labs. Discussed med's effects and SE's.   Over 30 minutes of exam, counseling, chart review, and critical decision making was performed.   Future Appointments  Date Time Provider Department Center  02/10/2019  9:00 AM Judd Gaudier, NP [facility] None     ------------------------------------------------------------------------------------------------------------------  HPI BP 98/64   Pulse 94   Temp (!) 97.5 F (36.4 C)   Wt 116 lb 12.8 oz (53 kg)   SpO2 99%   BMI 20.86 kg/m   23 y.o.female with hx of migraines, TBI presents for 6  month follow up for depression/anxiety,  ADD and migraines.   She was initiated this year on lamotrigine due to reports of ongoing labile mood and depressive episodes with suspicion for possible bipolar disorder. Had tried wellbutrin and zoloft in the past but felt zoloft made symptoms worse and couldn't sleep and made labile mood worse. Had seen counselors in the past with some question of possible bipolar. Declined referral for psych evaluation due to current pandemic.   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 This year has been difficult due to friend passing away due to [facility] and another in hospital as well.    She is newly working with a therapist - Lysle Rubens, has been beneficial. Believes type 2 biopolar disorder.   She is on lamotrigine and has been taking 150 mg, upped to 200 mg daily last week which had been recommended after last visit; she has tolerated without rash, she has noted improvement in mood, reports generalized improved mood, less lability, fewer outburts.  She is prescribed xanax for panic attacks and has found this very beneficial, reports using less, 1-3 per week during the day. She is taking 25-50 mg of benadryl for sleep, most nights this does work well for her.  She is on adderall for ADD; as needed for work. 60 tabs lasted from June to November this year.  She is aware this may make anxiety worse, but feels strongly she needs for mood and focus. Hasn't noted worse anxiety on days she takes.   She is trying to work on lifestyle,  getting out to exercise and walk as much as possible but typically tired after work, cooking healthy meals at home for family. She is staying at home with parents and brother. She calls a friend who is positive and supportive with current moods. Denies any drug use, reports rare alcohol with dinner.   BMI is Body mass index is 20.86 kg/m. Wt Readings from Last 3 Encounters:  02/02/19 116 lb 12.8 oz (53 kg)  07/20/18  129 lb (58.5 kg)  04/18/18 130 lb (59 kg)   She does have reported history of migraines since a child, also with hx of TBI; reports recently is improved, has some mild headaches that resolve with fluids and tylenol, migraines with some flashing once a month but resolve with rest in a dark room and ibuprofen/tylenol.   Past Medical History:  Diagnosis Date  . Anxiety   . Depression   . History of traumatic brain injury   . IBS (irritable bowel syndrome)    no current med.  . Migraines   . 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 Seasonal allergies      Allergies  Allergen Reactions  . Peanut-Containing Drug Products Shortness Of Breath and Itching    TREE NUTS - ITCHING OF MOUTH   . Shellfish Allergy Shortness Of Breath and Itching    ITCHING OF MOUTH  . Penicillins Hives  . Sulfa Antibiotics Hives    Current Outpatient Medications on File Prior to Visit  Medication Sig  . acetaminophen (TYLENOL) 325 MG tablet Take 650 mg by mouth every 6 (six) hours as needed.  . ALPRAZolam (XANAX) 0.5 MG tablet Take 1/2-1 tablet 2 - 3 x /day ONLY if needed for Severe  Anxiety or Panic Attack &  limit to 5 days /week to avoid addiction  . amphetamine-dextroamphetamine (ADDERALL) 20 MG tablet Take 1/2-1 tablet 1-2 x /day for ADD- max 5 days /week to avoid addiction  . cetirizine (ZYRTEC) 10 MG tablet Take 10 mg by mouth as needed.   . lamoTRIgine (LAMICTAL) 100 MG tablet Take 1 tablet 2 x /day for Mood   No current facility-administered medications on file prior to visit.     ROS: Review of Systems  Constitutional: Negative for malaise/fatigue and weight loss.  HENT: Negative for hearing loss and tinnitus.   Eyes: Negative for blurred vision and double vision.  Respiratory: Negative for cough, shortness of breath and wheezing.   Cardiovascular: Negative for chest pain, palpitations, orthopnea, claudication and leg swelling.  Gastrointestinal: Negative for abdominal pain, blood in  stool, constipation, diarrhea, heartburn, melena, nausea and vomiting.  Genitourinary: Negative.   Musculoskeletal: Negative for joint pain and myalgias.  Skin: Negative for rash.  Neurological: Positive for headaches (migraines monthly ). Negative for dizziness, tingling, sensory change and weakness.  Endo/Heme/Allergies: Negative for polydipsia.  Psychiatric/Behavioral: Negative.   All other systems reviewed and are negative.     Physical Exam:  BP 98/64   Pulse 94   Temp (!) 97.5 F (36.4 C)   Wt 116 lb 12.8 oz (53 kg)   SpO2 99%   BMI 20.86 kg/m   General Appearance: Well nourished, in no acute distress Eyes: PERRLA, EOMs, conjunctiva no swelling or erythema Sinuses: No Frontal/maxillary tenderness ENT/Mouth: Ext aud canals clear, TMs without erythema, bulging. No erythema, swelling, or exudate on post pharynx.  Tonsils not swollen or erythematous. Hearing normal.  Neck: Supple, thyroid normal.  Respiratory: Respiratory effort normal, BS equal bilaterally without rales, rhonchi, wheezing or stridor.  Cardio: RRR with no MRGs. Brisk peripheral pulses without edema.  Abdomen: Soft, + BS.  Non tender. Lymphatics: Non tender without lymphadenopathy.  Musculoskeletal: Full ROM, 5/5 strength, normal gait.  Skin: Warm, dry without rashes, lesions, ecchymosis.  Neuro: Cranial nerves intact. Normal muscle tone, no cerebellar symptoms. Sensation intact.  Psych:  Insight and Judgment appropriate. Depressed affect, non-pressured speech.   GAD 7 : Generalized Anxiety Score 02/02/2019  Nervous, Anxious, on Edge 3  Control/stop worrying 1  Worry too much - different things 0  Trouble relaxing 1  Restless 3  Easily annoyed or irritable 1  Afraid - awful might happen 2  Total GAD 7 Score 11  Anxiety Difficulty Somewhat difficult    [facility] 02/02/2019 06/02/2018 02/16/2017  Decreased Interest 1 1 0  Down, Depressed, Hopeless 2 3 0  PHQ - 2 Score 3 4 0  Altered  sleeping 1 3 -  Tired, decreased energy 1 3 -  Change in appetite 0 0 -  Feeling bad or failure about yourself  1 3 -  Trouble concentrating 0 3 -  Moving slowly or fidgety/restless 0 1 -  Suicidal thoughts 0 0 -  PHQ-9 Score 6 17 -  Difficult doing work/chores Somewhat difficult Very difficult -     Dan MakerAshley C Kelty Szafran, NP 12:41 PM [facility]

## 2019-02-02 ENCOUNTER — Other Ambulatory Visit: Payer: Self-pay

## 2019-02-02 ENCOUNTER — Encounter: Payer: Self-pay | Admitting: Adult Health

## 2019-02-02 ENCOUNTER — Ambulatory Visit (INDEPENDENT_AMBULATORY_CARE_PROVIDER_SITE_OTHER): Payer: Managed Care, Other (non HMO) | Admitting: Adult Health

## 2019-02-02 VITALS — BP 98/64 | HR 94 | Temp 97.5°F | Wt 116.8 lb

## 2019-02-02 DIAGNOSIS — G43809 Other migraine, not intractable, without status migrainosus: Secondary | ICD-10-CM

## 2019-02-02 DIAGNOSIS — F988 Other specified behavioral and emotional disorders with onset usually occurring in childhood and adolescence: Secondary | ICD-10-CM

## 2019-02-02 DIAGNOSIS — F319 Bipolar disorder, unspecified: Secondary | ICD-10-CM | POA: Diagnosis not present

## 2019-02-02 DIAGNOSIS — F411 Generalized anxiety disorder: Secondary | ICD-10-CM | POA: Diagnosis not present

## 2019-02-02 DIAGNOSIS — F5105 Insomnia due to other mental disorder: Secondary | ICD-10-CM | POA: Diagnosis not present

## 2019-02-02 DIAGNOSIS — F409 Phobic anxiety disorder, unspecified: Secondary | ICD-10-CM

## 2019-02-02 NOTE — Patient Instructions (Signed)
Buspirone tablets What is this medicine? BUSPIRONE (byoo SPYE rone) is used to treat anxiety disorders. This medicine may be used for other purposes; ask your health care provider or pharmacist if you have questions. COMMON BRAND NAME(S): BuSpar What should I tell my health care provider before I take this medicine? They need to know if you have any of these conditions:  kidney or liver disease  an unusual or allergic reaction to buspirone, other medicines, foods, dyes, or preservatives  pregnant or trying to get pregnant  breast-feeding How should I use this medicine? Take this medicine by mouth with a glass of water. Follow the directions on the prescription label. You may take this medicine with or without food. To ensure that this medicine always works the same way for you, you should take it either always with or always without food. Take your doses at regular intervals. Do not take your medicine more often than directed. Do not stop taking except on the advice of your doctor or health care professional. Talk to your pediatrician regarding the use of this medicine in children. Special care may be needed. Overdosage: If you think you have taken too much of this medicine contact a poison control center or emergency room at once. NOTE: This medicine is only for you. Do not share this medicine with others. What if I miss a dose? If you miss a dose, take it as soon as you can. If it is almost time for your next dose, take only that dose. Do not take double or extra doses. What may interact with this medicine? Do not take this medicine with any of the following medications:  linezolid  MAOIs like Carbex, Eldepryl, Marplan, Nardil, and Parnate  methylene blue  procarbazine This medicine may also interact with the following medications:  diazepam  digoxin  diltiazem  erythromycin  grapefruit juice  haloperidol  medicines for mental depression or mood problems  medicines  for seizures like carbamazepine, phenobarbital and phenytoin  nefazodone  other medications for anxiety  rifampin  ritonavir  some antifungal medicines like itraconazole, ketoconazole, and voriconazole  verapamil  warfarin This list may not describe all possible interactions. Give your health care provider a list of all the medicines, herbs, non-prescription drugs, or dietary supplements you use. Also tell them if you smoke, drink alcohol, or use illegal drugs. Some items may interact with your medicine. What should I watch for while using this medicine? Visit your doctor or health care professional for regular checks on your progress. It may take 1 to 2 weeks before your anxiety gets better. You may get drowsy or dizzy. Do not drive, use machinery, or do anything that needs mental alertness until you know how this drug affects you. Do not stand or sit up quickly, especially if you are an older patient. This reduces the risk of dizzy or fainting spells. Alcohol can make you more drowsy and dizzy. Avoid alcoholic drinks. What side effects may I notice from receiving this medicine? Side effects that you should report to your doctor or health care professional as soon as possible:  blurred vision or other vision changes  chest pain  confusion  difficulty breathing  feelings of hostility or anger  muscle aches and pains  numbness or tingling in hands or feet  ringing in the ears  skin rash and itching  vomiting  weakness Side effects that usually do not require medical attention (report to your doctor or health care professional if they continue or   are bothersome):  disturbed dreams, nightmares  headache  nausea  restlessness or nervousness  sore throat and nasal congestion  stomach upset This list may not describe all possible side effects. Call your doctor for medical advice about side effects. You may report side effects to FDA at 1-800-FDA-1088. Where should I  keep my medicine? Keep out of the reach of children. Store at room temperature below 30 degrees C (86 degrees F). Protect from light. Keep container tightly closed. Throw away any unused medicine after the expiration date. NOTE: This sheet is a summary. It may not cover all possible information. If you have questions about this medicine, talk to your doctor, pharmacist, or health care provider.  2020 Elsevier/Gold Standard (2009-09-26 18:06:11)       Living With Anxiety  After being diagnosed with an anxiety disorder, you may be relieved to know why you have felt or behaved a certain way. It is natural to also feel overwhelmed about the treatment ahead and what it will mean for your life. With care and support, you can manage this condition and recover from it. How to cope with anxiety Dealing with stress Stress is your body's reaction to life changes and events, both good and bad. Stress can last just a few hours or it can be ongoing. Stress can play a major role in anxiety, so it is important to learn both how to cope with stress and how to think about it differently. Talk with your health care provider or a counselor to learn more about stress reduction. He or she may suggest some stress reduction techniques, such as:  Music therapy. This can include creating or listening to music that you enjoy and that inspires you.  Mindfulness-based meditation. This involves being aware of your normal breaths, rather than trying to control your breathing. It can be done while sitting or walking.  Centering prayer. This is a kind of meditation that involves focusing on a word, phrase, or sacred image that is meaningful to you and that brings you peace.  Deep breathing. To do this, expand your stomach and inhale slowly through your nose. Hold your breath for 3-5 seconds. Then exhale slowly, allowing your stomach muscles to relax.  Self-talk. This is a skill where you identify thought patterns that  lead to anxiety reactions and correct those thoughts.  Muscle relaxation. This involves tensing muscles then relaxing them. Choose a stress reduction technique that fits your lifestyle and personality. Stress reduction techniques take time and practice. Set aside 5-15 minutes a day to do them. Therapists can offer training in these techniques. The training may be covered by some insurance plans. Other things you can do to manage stress include:  Keeping a stress diary. This can help you learn what triggers your stress and ways to control your response.  Thinking about how you respond to certain situations. You may not be able to control everything, but you can control your reaction.  Making time for activities that help you relax, and not feeling guilty about spending your time in this way. Therapy combined with coping and stress-reduction skills provides the best chance for successful treatment. Medicines Medicines can help ease symptoms. Medicines for anxiety include:  Anti-anxiety drugs.  Antidepressants.  Beta-blockers. Medicines may be used as the main treatment for anxiety disorder, along with therapy, or if other treatments are not working. Medicines should be prescribed by a health care provider. Relationships Relationships can play a big part in helping you recover. Try to  spend more time connecting with trusted friends and family members. Consider going to couples counseling, taking family education classes, or going to family therapy. Therapy can help you and others better understand the condition. How to recognize changes in your condition Everyone has a different response to treatment for anxiety. Recovery from anxiety happens when symptoms decrease and stop interfering with your daily activities at home or work. This may mean that you will start to:  Have better concentration and focus.  Sleep better.  Be less irritable.  Have more energy.  Have improved memory. It is  important to recognize when your condition is getting worse. Contact your health care provider if your symptoms interfere with home or work and you do not feel like your condition is improving. Where to find help and support: You can get help and support from these sources:  Self-help groups.  Online and [employer].  A trusted spiritual leader.  Couples counseling.  Family education classes.  Family therapy. Follow these instructions at home:  Eat a healthy diet that includes plenty of vegetables, fruits, whole grains, low-fat dairy products, and lean protein. Do not eat a lot of foods that are high in solid fats, added sugars, or salt.  Exercise. Most adults should do the following: ? Exercise for at least 150 minutes each week. The exercise should increase your heart rate and make you sweat (moderate-intensity exercise). ? Strengthening exercises at least twice a week.  Cut down on caffeine, tobacco, alcohol, and other potentially harmful substances.  Get the right amount and quality of sleep. Most adults need 7-9 hours of sleep each night.  Make choices that simplify your life.  Take over-the-counter and prescription medicines only as told by your health care provider.  Avoid caffeine, alcohol, and certain over-the-counter cold medicines. These may make you feel worse. Ask your pharmacist which medicines to avoid.  Keep all follow-up visits as told by your health care provider. This is important. Questions to ask your health care provider  Would I benefit from therapy?  How often should I follow up with a health care provider?  How long do I need to take medicine?  Are there any long-term side effects of my medicine?  Are there any alternatives to taking medicine? Contact a health care provider if:  You have a hard time staying focused or finishing daily tasks.  You spend many hours a day feeling worried about everyday life.  You become exhausted by  worry.  You start to have headaches, feel tense, or have nausea.  You urinate more than normal.  You have diarrhea. Get help right away if:  You have a racing heart and shortness of breath.  You have thoughts of hurting yourself or others. If you ever feel like you may hurt yourself or others, or have thoughts about taking your own life, get help right away. You can go to your nearest emergency department or call:  Your local emergency services (911 in the [place]  A suicide crisis helpline, such as the [place] at [phone]. This is open 24-hours a day. Summary  Taking steps to deal with stress can help calm you.  Medicines cannot cure anxiety disorders, but they can help ease symptoms.  Family, friends, and partners can play a big part in helping you recover from an anxiety disorder. This information is not intended to replace advice given to you by your health care provider. Make sure you discuss any questions you have with  your health care provider. Document Released: 02/11/2016 Document Revised: 01/29/2017 Document Reviewed: 02/11/2016 Elsevier Patient Education  2020 [employer].

## 2019-02-09 ENCOUNTER — Other Ambulatory Visit: Payer: Self-pay | Admitting: Adult Health

## 2019-02-09 MED ORDER — BUSPIRONE HCL 5 MG PO TABS: 5.0000 mg | ORAL_TABLET | Freq: Three times a day (TID) | ORAL | 1 refills | Status: DC

## 2019-02-10 ENCOUNTER — Encounter: Payer: Managed Care, Other (non HMO) | Admitting: Adult Health

## 2019-02-22 ENCOUNTER — Other Ambulatory Visit: Payer: Self-pay | Admitting: Adult Health

## 2019-03-21 ENCOUNTER — Other Ambulatory Visit: Payer: Self-pay | Admitting: Internal Medicine

## 2019-05-03 ENCOUNTER — Other Ambulatory Visit: Payer: Self-pay

## 2019-05-03 DIAGNOSIS — F988 Other specified behavioral and emotional disorders with onset usually occurring in childhood and adolescence: Secondary | ICD-10-CM

## 2019-05-04 MED ORDER — AMPHETAMINE-DEXTROAMPHETAMINE 20 MG PO TABS: ORAL_TABLET | ORAL | 0 refills | Status: DC

## 2019-05-04 MED ORDER — ALPRAZOLAM 0.5 MG PO TABS: ORAL_TABLET | ORAL | 0 refills | Status: DC

## 2019-05-15 ENCOUNTER — Encounter: Payer: Self-pay | Admitting: Certified Nurse Midwife

## 2019-05-17 ENCOUNTER — Encounter: Payer: Self-pay | Admitting: Certified Nurse Midwife

## 2019-06-22 ENCOUNTER — Ambulatory Visit: Payer: Managed Care, Other (non HMO) | Attending: Internal Medicine

## 2019-06-22 DIAGNOSIS — Z23 Encounter for immunization: Secondary | ICD-10-CM

## 2019-06-22 NOTE — Progress Notes (Signed)
   Covid-19 Vaccination Clinic  Name:  Hannah Reeves    MRN: [id number] DOB: 1995-04-04  06/22/2019  Hannah Reeves was observed post Covid-19 immunization for 15 minutes without incident. She was provided with Vaccine Information Sheet and instruction to access the V-Safe system.   Hannah Reeves was instructed to call 911 with any severe reactions post vaccine: Marland Kitchen Difficulty breathing  . Swelling of face and throat  . A fast heartbeat  . A bad rash all over body  . Dizziness and weakness   Immunizations Administered    Name Date Dose VIS Date Route   Pfizer COVID-19 Vaccine 06/22/2019 12:52 PM 0.3 mL 04/26/2018 Intramuscular   Manufacturer: [employer], [employer]   Lot: [id number]   NDC: [address]-5

## 2019-07-05 ENCOUNTER — Other Ambulatory Visit: Payer: Self-pay

## 2019-07-05 DIAGNOSIS — F988 Other specified behavioral and emotional disorders with onset usually occurring in childhood and adolescence: Secondary | ICD-10-CM

## 2019-07-05 MED ORDER — ALPRAZOLAM 0.5 MG PO TABS: ORAL_TABLET | ORAL | 0 refills | Status: DC

## 2019-07-05 MED ORDER — AMPHETAMINE-DEXTROAMPHETAMINE 20 MG PO TABS: ORAL_TABLET | ORAL | 0 refills | Status: DC

## 2019-07-17 ENCOUNTER — Ambulatory Visit: Payer: Managed Care, Other (non HMO) | Attending: Internal Medicine

## 2019-07-17 DIAGNOSIS — Z23 Encounter for immunization: Secondary | ICD-10-CM

## 2019-07-17 NOTE — Progress Notes (Signed)
   Covid-19 Vaccination Clinic  Name:  Hannah Reeves    MRN: [id number] DOB: 1995/04/06  07/17/2019  Hannah Reeves was observed post Covid-19 immunization for 65 minutes without incident. She was provided with Vaccine Information Sheet and instruction to access the V-Safe system.   Hannah Reeves was instructed to call 911 with any severe reactions post vaccine: Marland Kitchen Difficulty breathing  . Swelling of face and throat  . A fast heartbeat  . A bad rash all over body  . Dizziness and weakness   Immunizations Administered    Name Date Dose VIS Date Route   Pfizer COVID-19 Vaccine 07/17/2019  1:11 PM 0.3 mL 04/26/2018 Intramuscular   Manufacturer: [employer], [employer]   Lot: [id number]   NDC: [address]-2

## 2019-07-17 NOTE — Progress Notes (Signed)
   Covid-19 Vaccination Clinic  Name:  Hannah Reeves    MRN: [id number] DOB: Sep 05, 1995  07/17/2019  Hannah Reeves was observed post Covid-19 immunization for 65 minutes .  During the observation period, she experienced an adverse reaction with the following symptoms:  dizziness.  Assessment : Time of assessment 1320. Anxious, sweating, feeling light headed.  Actions taken:  Vitals sign taken   There were no vitals filed for this visit.  Medications administered: No medication administered.  Disposition: Reports no further symptoms of adverse reaction after observation for 65 minutes. Discharged home. The Patient was provided with Vaccine Information Sheet and instruction to access the V-Safe system.    Immunizations Administered    Name Date Dose VIS Date Route   Pfizer COVID-19 Vaccine 07/17/2019  1:11 PM 0.3 mL 04/26/2018 Intramuscular   Manufacturer: [employer], [employer]   Lot: [id number]   NDC: [address]-8

## 2019-07-18 ENCOUNTER — Telehealth: Payer: Self-pay | Admitting: *Deleted

## 2019-07-18 NOTE — Telephone Encounter (Signed)
Called and spoke with the patient in regards to her second COVID vaccine that she received yesterday. She states that she is having flu like symptoms that are a common side effect of the vaccine. She did state that she had a small rash yesterday on her chest and neck but it has gone away. She has had no other symptoms that match an allergic reaction. Advised to patient that since she is now fully vaccinated to call with any questions or direction prior to receiving future COVID vaccines. Patient verbalized understanding.

## 2019-08-17 ENCOUNTER — Encounter: Payer: Self-pay | Admitting: Adult Health

## 2019-08-17 NOTE — Progress Notes (Deleted)
Complete Physical  Assessment and Plan:  Diagnoses and all orders for this visit:  Encounter for routine adult health examination with abnormal findings  Migraine without aura and without status migrainosus, not intractable  Mild intermittent asthma without complication  Irritable bowel syndrome, unspecified type  Insomnia due to anxiety and fear  Bipolar depression (HCC)  Anxiety, generalized  Attention deficit disorder (ADD) without hyperactivity  H/O cold sores     Discussed med's effects and SE's. Screening labs and tests as requested with regular follow-up as recommended. Over 40 minutes of exam, counseling, chart review, and complex, high level critical decision making was performed this visit.   Future Appointments  Date Time Provider [place]  08/22/2019 10:30 AM Liane Comber, NP [facility] None     HPI  24 y.o. female  presents for a complete physical and follow up for has Asthma; IBS (irritable bowel syndrome); H/O cold sores; ADD (attention deficit disorder); Bipolar depression ([place]); Migraine without aura; Anxiety, generalized; and Insomnia due to anxiety and fear on their problem list.     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 This year has been difficult due to friend passing away due to [facility] and another in hospital as well.    ***migraine  Asthma   She was initiated last year on lamotrigine due to labile mood and depressive episodes with suspicion for bipolar disorder. Taking 200 mg daily with perceived benefit, improved mood, less lability, fewer outbursts. She takes buspar 5 mg TID, PRN xanax for anxiety. Benadryl 25-50 mg for sleep as needed.  She is newly working with a therapist - Dessie Coma, has been beneficial. Believes type 2 biopolar disorder.   She is on adderall for ADD for many years; takes as needed for work She is aware this may make anxiety worse, but  feels strongly she needs for mood and focus. Hasn't noted worse anxiety on days she takes.   She is staying at home with parents and brother. She calls a friend who is positive and supportive with current moods. Denies any drug use, reports rare alcohol with dinner.  She is trying to work on lifestyle, getting out to exercise and walk as much as possible but typically tired after work, cooking healthy meals at home for family.   BMI is There is no height or weight on file to calculate BMI., she {HAS HAS WGN:56213} been working on diet and exercise. Wt Readings from Last 3 Encounters:  02/02/19 116 lb 12.8 oz (53 kg)  07/20/18 129 lb (58.5 kg)  04/18/18 130 lb (59 kg)   Her blood pressure {HAS HAS NOT:18834} been controlled at home, today their BP is   She {DOES_DOES YQM:57846} workout. She denies chest pain, shortness of breath, dizziness.   She {ACTION; IS/IS NGE:[id number]} on cholesterol medication and denies myalgias. Her cholesterol {ACTION; IS/IS NOT:21021397} at goal. The cholesterol last visit was:   Lab Results  Component Value Date   CHOL 194 06/28/2013   HDL 56 06/28/2013   LDLCALC 124 (H) 06/28/2013   TRIG 70 06/28/2013   CHOLHDL 3.5 06/28/2013   She {Has/has not:18111} been working on diet and exercise for glucose management. Last A1C in the office was:  Lab Results  Component Value Date   HGBA1C 5.2 06/28/2013   Last GFR: Lab Results  Component Value Date   GFRNONAA >89 02/15/2014   Patient is on Vitamin D supplement.   Lab Results  Component  Value Date   VD25OH 63 06/28/2013      Current Medications:  Current Outpatient Medications on File Prior to Visit  Medication Sig Dispense Refill  . acetaminophen (TYLENOL) 325 MG tablet Take 650 mg by mouth every 6 (six) hours as needed.    . ALPRAZolam (XANAX) 0.5 MG tablet 1/2-1 TAB 2-3X/DAY ONLY IF NEEDED FOR SEVERE ANXIETY/PANIC ATTACK. MAX 5DAYS/WK TO AVOID ADDICTION 30 tablet 0  . amphetamine-dextroamphetamine  (ADDERALL) 20 MG tablet Take 1/2-1 tablet 1-2 x /day for ADD- max 5 days /week to avoid addiction 60 tablet 0  . busPIRone (BUSPAR) 5 MG tablet TAKE 1 TABLET 3 TIMES A DAY AS NEEDED FOR ANXIETY 270 tablet 1  . cetirizine (ZYRTEC) 10 MG tablet Take 10 mg by mouth as needed.     . lamoTRIgine (LAMICTAL) 100 MG tablet Take 1 tablet 2 x /day for Mood 180 tablet 1   No current facility-administered medications on file prior to visit.   Allergies:  Allergies  Allergen Reactions  . Peanut-Containing Drug Products Shortness Of Breath and Itching    TREE NUTS - ITCHING OF MOUTH   . Shellfish Allergy Shortness Of Breath and Itching    ITCHING OF MOUTH  . Penicillins Hives  . Sulfa Antibiotics Hives   Medical History:  She has Asthma; IBS (irritable bowel syndrome); H/O cold sores; ADD (attention deficit disorder); Bipolar depression (HCC); Migraine without aura; Anxiety, generalized; and Insomnia due to anxiety and fear on their problem list. Health Maintenance:   Immunization History  Administered Date(s) Administered  . HPV Quadrivalent 03/02/2010  . PFIZER SARS-COV-2 Vaccination 06/22/2019, 07/17/2019  . PPD Test 07/29/2017    Tetanus: Flu vaccine: HPV: 2012 Covid 19: 2/2, 2021, pfizer  LMP: No LMP recorded. (Menstrual status: IUD). Pap: 07/2018 neg HPV by gyn Dr. Edward Jolly MGM: n/a  Colonoscopy: n/a EGD: n/a  Last Dental Exam: Last Eye Exam:  Patient Care Team: Lucky Cowboy, MD as PCP - General (Internal Medicine) Amelia Jo, MD as Consulting Physician (Neurology)  Surgical History:  She has a past surgical history that includes Wisdom tooth extraction; Tonsillectomy (N/A, 11/11/2016); and Intrauterine device (iud) insertion. Family History:  Herfamily history includes Allergies in her mother; Stroke in her paternal grandfather. Social History:  She reports that she has never smoked. She has never used smokeless tobacco. She reports current alcohol use of about 2.0  standard drinks of alcohol per week. She reports previous drug use. Drug: Marijuana.  Review of Systems: Review of Systems  Constitutional: Negative for malaise/fatigue and weight loss.  HENT: Negative for hearing loss and tinnitus.   Eyes: Negative for blurred vision and double vision.  Respiratory: Negative for cough, shortness of breath and wheezing.   Cardiovascular: Negative for chest pain, palpitations, orthopnea, claudication and leg swelling.  Gastrointestinal: Negative for abdominal pain, blood in stool, constipation, diarrhea, heartburn, melena, nausea and vomiting.  Genitourinary: Negative.   Musculoskeletal: Negative for joint pain and myalgias.  Skin: Negative for rash.  Neurological: Negative for dizziness, tingling, sensory change, weakness and headaches.  Endo/Heme/Allergies: Negative for polydipsia.  Psychiatric/Behavioral: Negative.   All other systems reviewed and are negative.   Physical Exam: Estimated body mass index is 20.86 kg/m as calculated from the following:   Height as of 07/20/18: 5' 2.75" (1.594 m).   Weight as of 02/02/19: 116 lb 12.8 oz (53 kg). There were no vitals taken for this visit. General Appearance: Well nourished, in no apparent distress.  Eyes: PERRLA, EOMs, conjunctiva no swelling  or erythema, normal fundi and vessels.  Sinuses: No Frontal/maxillary tenderness  ENT/Mouth: Ext aud canals clear, normal light reflex with TMs without erythema, bulging. Good dentition. No erythema, swelling, or exudate on post pharynx. Tonsils not swollen or erythematous. Hearing normal.  Neck: Supple, thyroid normal. No bruits  Respiratory: Respiratory effort normal, BS equal bilaterally without rales, rhonchi, wheezing or stridor.  Cardio: RRR without murmurs, rubs or gallops. Brisk peripheral pulses without edema.  Chest: symmetric, with normal excursions and percussion.  Breasts: Symmetric, without lumps, nipple discharge, retractions. *** Abdomen: Soft,  nontender, no guarding, rebound, hernias, masses, or organomegaly.  Lymphatics: Non tender without lymphadenopathy.  Genitourinary: *** Musculoskeletal: Full ROM all peripheral extremities,5/5 strength, and normal gait.  Skin: Warm, dry without rashes, lesions, ecchymosis. Neuro: Cranial nerves intact, reflexes equal bilaterally. Normal muscle tone, no cerebellar symptoms. Sensation intact.  Psych: Awake and oriented X 3, normal affect, Insight and Judgment appropriate.   EKG: WNL in 2015, defer  Dan Maker 9:51 AM [facility]

## 2019-08-22 ENCOUNTER — Encounter: Payer: Managed Care, Other (non HMO) | Admitting: Adult Health

## 2019-08-29 ENCOUNTER — Other Ambulatory Visit: Payer: Self-pay

## 2019-08-29 DIAGNOSIS — F988 Other specified behavioral and emotional disorders with onset usually occurring in childhood and adolescence: Secondary | ICD-10-CM

## 2019-08-30 MED ORDER — ALPRAZOLAM 0.5 MG PO TABS: ORAL_TABLET | ORAL | 0 refills | Status: DC

## 2019-08-30 MED ORDER — AMPHETAMINE-DEXTROAMPHETAMINE 20 MG PO TABS: ORAL_TABLET | ORAL | 0 refills | Status: DC

## 2019-09-15 NOTE — Progress Notes (Signed)
Complete Physical  Assessment and Plan:  Dot LanesKrista was seen today for annual exam.  Diagnoses and all orders for this visit:  Encounter for routine adult health examination without abnormal findings Health Maintenance- Discussed STD testing, safe sex, alcohol and drug awareness, drinking and driving dangers, wearing a seat belt and general safety measures for young adult.  Migraine without aura and without status migrainosus, not intractable Frequent headaches Increased recent headaches - migraines vs tension Discussed restarting topamax, wants to defer for now, discussed effexor which she will consider Tylenol/ibuprofen and dark room does help Wants to work on lifestyle, keep headache diary Follow up dental for TMJ/grinding  Stress management techniques discussed, increase water, good sleep hygiene discussed, increase exercise, and increase veggies.  -     Magnesium  Mild intermittent asthma without complication No recent flares; monitor; avoid triggers  Irritable bowel syndrome, unspecified type Working on lifestyle; if not on soluble fiber add if having flares.   Insomnia due to anxiety and fear Managing with benadryl/xanax PRN  good sleep hygiene discussed, increase day time activity, try melatonin or benadryl if this does not help we will call in sleep medication.   H/O cold sores Continue PRN antiviral   Bipolar depression (HCC) Lamictal is helping, discussed addition of effexor but declines today, will consider, info given Encouraged follow up with counselor; sig work related stress ? R/t hx of TBI; monitor closely   Anxiety, generalized Discussed possible benefit with effexor for mood and migraines; interested but wants to hold off, will follow up counseling and looking for new job, will try lifestyle for headache Stress management techniques discussed, increase water, good sleep hygiene discussed, increase exercise, and increase veggies.  -     TSH  Attention deficit  disorder (ADD) without hyperactivity Continue medications Helps with focus, no AE's. The patient was counseled on the addictive nature of the medication and was encouraged to take drug holidays when not needed.   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  Check lipid panel.  -     TSH -     Lipid panel  Screening for thyroid disorder -     TSH  Cold intolerance Low BMI vs thyroid, screen anemia;  Encouraged dressing appropriately for environment  Medication management -     CBC with Differential/Platelet -     COMPLETE METABOLIC PANEL WITH GFR -     Magnesium -     Urinalysis, Routine w reflex microscopic  B12 deficiency -     Vitamin B12  Vitamin D deficiency -     VITAMIN D 25 Hydroxy (Vit-D Deficiency, Fractures)   Discussed med's effects and SE's. Screening labs and tests as requested with regular follow-up as recommended. Over 40 minutes of exam, counseling, chart review, and complex, high level critical decision making was performed this visit.   Future Appointments  Date Time Provider Department Center  03/25/2020  2:30 PM Judd Gaudierorbett, Annaya Bangert, NP [facility] None  09/26/2020  2:00 PM Judd Gaudierorbett, Marquise Lambson, NP [facility] None     HPI  24 y.o. female  presents for a complete physical and follow up for has Asthma; IBS (irritable bowel syndrome); H/O cold sores; ADD (attention deficit disorder); Bipolar depression (HCC); Migraine without aura; Anxiety, generalized; Insomnia due to anxiety and fear; and Cognitive deficit as late effect of traumatic brain injury [facility](HCC) on their problem list.   She  has boyfriend of 1.5 years, good relationship, has IUD and uses condoms, declines STD testing today. She follows with GYN Dr. Conley Simmonds at [facility] care, goes annually.   She has hx of TBI falling off of a porch deck in 2016,  reports residual cognitive difficulties that never fully resolved, though does maintain good employment. 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   Dad has been sick and in the hospital, had aneurysm, ? Genetic component and was advised may need to start screening with MRAs at some point by specialist, PGF also had, unsure of details will clarify.   She was initiated last year on lamotrigine due to labile mood and depressive episodes with suspicion for type 2 bipolar disorder. Taking 200 mg daily with perceived benefit, improved mood, less lability, fewer outbursts. She stopped taking buspirone for anxiety, didn't help much. Takes PRN xanax for anxiety (30 tabs last 2 months). Benadryl 25-50 mg for sleep as needed. She was working with a therapist, had been beneficial but stopped with dad's health situation, wants to find someone with more experience. She is on adderall for ADD for many years; takes as needed for work She is aware this may make anxiety worse, but feels strongly she needs for mood and focus. Hasn't noted worse anxiety on days she takes.   She has reported migraines which were severe as a teen, formerly on topamax, but having more headaches in recent months with work stress, every other day, start in afternoons but variable between unilateral "migraine" with nausea and tension in neck. Also with TMJ. Taking tylenol and lying down in dark room with variable benefit. Concerned about always feeling cold.   BMI is Body mass index is 19.76 kg/m.,  She is staying at home with parents and brother.  She is trying to work on lifestyle, getting out to exercise and walk as much as possible but typically tired after work, cooking healthy meals at home for family, but working from home and forgets to eat regularly  Has cut down on alcohol,  previous 4 glasses of wine, down to 1-2 per week Denies drug use Wt Readings from Last 3 Encounters:  09/18/19 112 lb 12.8 oz (51.2 kg)  02/02/19 116 lb 12.8 oz (53 kg)  07/20/18 129 lb (58.5 kg)   Today their BP is BP: 102/60 She does workout. She denies chest pain, shortness of breath. She does have episodes of dizziness   She is not on cholesterol medication and denies myalgias. Her cholesterol is not at goal. The cholesterol last visit was:   Lab Results  Component Value Date   CHOL 194 06/28/2013   HDL 56 06/28/2013   LDLCALC 124 (H) 06/28/2013   TRIG 70 06/28/2013   CHOLHDL 3.5 06/28/2013   She has been working on diet and exercise for glucose management. Last A1C in the office was:  Lab Results  Component Value Date   HGBA1C 5.2 06/28/2013   Last GFR: Lab Results  Component Value Date   GFRNONAA >89 02/15/2014   Patient is not currently on Vitamin D supplement, states was taking at last check:    Lab Results  Component Value Date   VD25OH 63 06/28/2013        Current Medications:  Current Outpatient Medications on File Prior to Visit  Medication Sig Dispense  Refill  . acetaminophen (TYLENOL) 325 MG tablet Take 650 mg by mouth every 6 (six) hours as needed.    . ALPRAZolam (XANAX) 0.5 MG tablet 1/2-1 TAB 2-3X/DAY ONLY IF NEEDED FOR SEVERE ANXIETY/PANIC ATTACK. MAX 5DAYS/WK TO AVOID ADDICTION 30 tablet 0  . amphetamine-dextroamphetamine (ADDERALL) 20 MG tablet Take 1/2-1 tablet 1-2 x /day for ADD- max 5 days /week to avoid addiction 60 tablet 0  . cetirizine (ZYRTEC) 10 MG tablet Take 10 mg by mouth as needed.     . lamoTRIgine (LAMICTAL) 100 MG tablet Take 1 tablet 2 x /day for Mood 180 tablet 1  . busPIRone (BUSPAR) 5 MG tablet TAKE 1 TABLET 3 TIMES A DAY AS NEEDED FOR ANXIETY (Patient not taking: Reported on 09/18/2019) 270 tablet 1   No current facility-administered medications on file prior to visit.   Allergies:  Allergies  Allergen Reactions  .  Peanut-Containing Drug Products Shortness Of Breath and Itching    TREE NUTS - ITCHING OF MOUTH   . Shellfish Allergy Shortness Of Breath and Itching    ITCHING OF MOUTH  . Penicillins Hives  . Sulfa Antibiotics Hives   Medical History:  She has Asthma; IBS (irritable bowel syndrome); H/O cold sores; ADD (attention deficit disorder); Bipolar depression (HCC); Migraine without aura; Anxiety, generalized; Insomnia due to anxiety and fear; and Cognitive deficit as late effect of traumatic brain injury (HCC) on their problem list. Health Maintenance:   Immunization History  Administered Date(s) Administered  . HPV Quadrivalent 03/02/2010  . PFIZER SARS-COV-2 Vaccination 06/22/2019, 07/17/2019  . PPD Test 07/29/2017    Tetanus: patient states has had  <10 years, declines  Flu vaccine: gets annually at pharmacy  HPV: 2012 Covid 19: 2/2, 2021, pfizer  LMP: No LMP recorded. (Menstrual status: IUD). Pap: 07/2018 neg HPV by gyn Dr. Edward Jolly MGM: n/a  Colonoscopy: n/a EGD: n/a  Last Dental Exam: [facility], last 2021 Last Eye Exam: [facility], glasses, last 2020  Patient Care Team: Lucky Cowboy, MD as PCP - General (Internal Medicine) Amelia Jo, MD as Consulting Physician (Neurology)  Surgical History:  She has a past surgical history that includes Wisdom tooth extraction; Tonsillectomy (N/A, 11/11/2016); and Intrauterine device (iud) insertion. Family History:  Herfamily history includes Allergies in her mother; Aneurysm in her paternal grandfather; Anuerysm in her father; Stroke in her paternal grandfather. Social History:  She reports that she has never smoked. She has never used smokeless tobacco. She reports current alcohol use of about 2.0 standard drinks of alcohol per week. She reports previous drug use. Drug: Marijuana.  Review of Systems: Review of Systems  Constitutional: Negative for malaise/fatigue and weight loss.  HENT: Negative for hearing loss and  tinnitus.   Eyes: Negative for blurred vision and double vision.  Respiratory: Negative for cough, shortness of breath and wheezing.   Cardiovascular: Negative for chest pain, palpitations, orthopnea, claudication and leg swelling.  Gastrointestinal: Negative for abdominal pain, blood in stool, constipation, diarrhea, heartburn, melena, nausea and vomiting.  Genitourinary: Negative.   Musculoskeletal: Negative for joint pain and myalgias.  Skin: Negative for rash.  Neurological: Positive for headaches (near daily HA in afternoons, some photosensitvitiy, nausea, some unilateral mostly bilateral ). Negative for dizziness, tingling, sensory change and weakness.  Endo/Heme/Allergies: Negative for polydipsia.       Always cold  Psychiatric/Behavioral: Negative for depression, memory loss, substance abuse and suicidal ideas. The patient is nervous/anxious and has insomnia.   All other systems reviewed and are negative.  Physical Exam: Estimated body mass index is 19.76 kg/m as calculated from the following:   Height as of this encounter: 5' 3.35" (1.609 m).   Weight as of this encounter: 112 lb 12.8 oz (51.2 kg). BP 102/60   Pulse 68   Temp (!) 96.4 F (35.8 C)   Ht 5' 3.35" (1.609 m)   Wt 112 lb 12.8 oz (51.2 kg)   SpO2 96%   BMI 19.76 kg/m  General Appearance: Slim young caucasian female, well dressed, good hygiene in no apparent distress.  Eyes: PERRLA, EOMs, conjunctiva no swelling or erythema Sinuses: No Frontal/maxillary tenderness  ENT/Mouth: Ext aud canals clear, normal light reflex with TMs without erythema, bulging. Good dentition. No erythema, swelling, or exudate on post pharynx. Tonsils not swollen or erythematous. Hearing normal.  Neck: Supple, thyroid normal. No bruits  Respiratory: Respiratory effort normal, BS equal bilaterally without rales, rhonchi, wheezing or stridor.  Cardio: RRR without murmurs, rubs or gallops. Brisk peripheral pulses without edema.  Chest:  symmetric, with normal excursions and percussion.  Breasts: defer to GYN Abdomen: Soft, nontender, no guarding, rebound, hernias, masses, or organomegaly.  Lymphatics: Non tender without lymphadenopathy.  Genitourinary: defer to GYN Musculoskeletal: Full ROM all peripheral extremities,5/5 strength, and normal gait.  Skin: Warm, dry without rashes, lesions, ecchymosis. Neuro: Cranial nerves intact, reflexes equal bilaterally. Normal muscle tone, no cerebellar symptoms. Sensation intact.  Psych: Awake and oriented X 3, normal affect, Insight and Judgment appropriate.   EKG: WNL in 2015, defer  Dan Maker 12:26 PM [facility]

## 2019-09-18 ENCOUNTER — Ambulatory Visit (INDEPENDENT_AMBULATORY_CARE_PROVIDER_SITE_OTHER): Payer: Managed Care, Other (non HMO) | Admitting: Adult Health

## 2019-09-18 ENCOUNTER — Encounter: Payer: Self-pay | Admitting: Adult Health

## 2019-09-18 ENCOUNTER — Other Ambulatory Visit: Payer: Self-pay

## 2019-09-18 VITALS — BP 102/60 | HR 68 | Temp 96.4°F | Ht 63.35 in | Wt 112.8 lb

## 2019-09-18 DIAGNOSIS — Z13220 Encounter for screening for lipoid disorders: Secondary | ICD-10-CM | POA: Diagnosis not present

## 2019-09-18 DIAGNOSIS — Z1389 Encounter for screening for other disorder: Secondary | ICD-10-CM

## 2019-09-18 DIAGNOSIS — R519 Headache, unspecified: Secondary | ICD-10-CM

## 2019-09-18 DIAGNOSIS — Z681 Body mass index (BMI) 19 or less, adult: Secondary | ICD-10-CM | POA: Insufficient documentation

## 2019-09-18 DIAGNOSIS — F988 Other specified behavioral and emotional disorders with onset usually occurring in childhood and adolescence: Secondary | ICD-10-CM

## 2019-09-18 DIAGNOSIS — Z79899 Other long term (current) drug therapy: Secondary | ICD-10-CM | POA: Diagnosis not present

## 2019-09-18 DIAGNOSIS — F411 Generalized anxiety disorder: Secondary | ICD-10-CM

## 2019-09-18 DIAGNOSIS — J4520 Mild intermittent asthma, uncomplicated: Secondary | ICD-10-CM

## 2019-09-18 DIAGNOSIS — Z0000 Encounter for general adult medical examination without abnormal findings: Secondary | ICD-10-CM | POA: Diagnosis not present

## 2019-09-18 DIAGNOSIS — Z8619 Personal history of other infectious and parasitic diseases: Secondary | ICD-10-CM

## 2019-09-18 DIAGNOSIS — Z130 Encounter for screening for diseases of the blood and blood-forming organs and certain disorders involving the immune mechanism: Secondary | ICD-10-CM | POA: Diagnosis not present

## 2019-09-18 DIAGNOSIS — Z1329 Encounter for screening for other suspected endocrine disorder: Secondary | ICD-10-CM

## 2019-09-18 DIAGNOSIS — E538 Deficiency of other specified B group vitamins: Secondary | ICD-10-CM

## 2019-09-18 DIAGNOSIS — E785 Hyperlipidemia, unspecified: Secondary | ICD-10-CM | POA: Insufficient documentation

## 2019-09-18 DIAGNOSIS — S069X0S Unspecified intracranial injury without loss of consciousness, sequela: Secondary | ICD-10-CM

## 2019-09-18 DIAGNOSIS — K589 Irritable bowel syndrome without diarrhea: Secondary | ICD-10-CM

## 2019-09-18 DIAGNOSIS — S069XAS Unspecified intracranial injury with loss of consciousness status unknown, sequela: Secondary | ICD-10-CM | POA: Insufficient documentation

## 2019-09-18 DIAGNOSIS — G43009 Migraine without aura, not intractable, without status migrainosus: Secondary | ICD-10-CM

## 2019-09-18 DIAGNOSIS — F409 Phobic anxiety disorder, unspecified: Secondary | ICD-10-CM

## 2019-09-18 DIAGNOSIS — E559 Vitamin D deficiency, unspecified: Secondary | ICD-10-CM | POA: Diagnosis not present

## 2019-09-18 DIAGNOSIS — F068 Other specified mental disorders due to known physiological condition: Secondary | ICD-10-CM | POA: Insufficient documentation

## 2019-09-18 DIAGNOSIS — F319 Bipolar disorder, unspecified: Secondary | ICD-10-CM

## 2019-09-18 DIAGNOSIS — R6889 Other general symptoms and signs: Secondary | ICD-10-CM

## 2019-09-18 DIAGNOSIS — Z8249 Family history of ischemic heart disease and other diseases of the circulatory system: Secondary | ICD-10-CM

## 2019-09-18 NOTE — Patient Instructions (Signed)
[name] , Thank you for taking time to come for your Annual Wellness Visit. I appreciate your ongoing commitment to your health goals. Please review the following plan we discussed and let me know if I can assist you in the future.   This is a list of the screening recommended for you and due dates:  Health Maintenance  Topic Date Due  . Tetanus Vaccine  Never done  . Flu Shot  10/01/2019  . Pap Smear  07/19/2021  . Pap Smear  07/19/2021  . COVID-19 Vaccine  Completed  .  Hepatitis C: One time screening is recommended by Center for Disease Control  (CDC) for  adults born from [birth year] through 1966.   Completed  . HIV Screening  Completed     Know what a healthy weight is for you (roughly BMI <25) and aim to maintain this  Aim for 7+ servings of fruits and vegetables daily  65-80+ fluid ounces of water or unsweet tea for healthy kidneys  Limit to max 1 drink of alcohol per day; avoid smoking/tobacco  Limit animal fats in diet for cholesterol and heart health - choose grass fed whenever available  Avoid highly processed foods, and foods high in saturated/trans fats  Aim for low stress - take time to unwind and care for your mental health  Aim for 150 min of moderate intensity exercise weekly for heart health, and weights twice weekly for bone health  Aim for 7-9 hours of sleep daily   Try working on stress, wear clothing to prevent feeling cold, try heat to neck/shoulders, might try medical massage for TMJ/tension headaches    General Headache Without Cause A headache is pain or discomfort felt around the head or neck area. The specific cause of a headache may not be found. There are many causes and types of headaches. A few common ones are:  Tension headaches.  Migraine headaches.  Cluster headaches.  Chronic daily headaches. Follow these instructions at home: Watch your condition for any changes. Let your health care provider know about them. Take these steps to  help with your condition: Managing pain      Take over-the-counter and prescription medicines only as told by your health care provider.  Lie down in a dark, quiet room when you have a headache.  If directed, put ice on your head and neck area: ? Put ice in a plastic bag. ? Place a towel between your skin and the bag. ? Leave the ice on for 20 minutes, 2-3 times per day.  If directed, apply heat to the affected area. Use the heat source that your health care provider recommends, such as a moist heat pack or a heating pad. ? Place a towel between your skin and the heat source. ? Leave the heat on for 20-30 minutes. ? Remove the heat if your skin turns bright red. This is especially important if you are unable to feel pain, heat, or cold. You may have a greater risk of getting burned.  Keep lights dim if bright lights bother you or make your headaches worse. Eating and drinking  Eat meals on a regular schedule.  If you drink alcohol: ? Limit how much you use to:  0-1 drink a day for women.  0-2 drinks a day for men. ? Be aware of how much alcohol is in your drink. In the [place] one drink equals one 12 oz bottle of beer (355 mL), one 5 oz glass of wine (148 mL),  or one 1 oz glass of hard liquor (44 mL).  Stop drinking caffeine, or decrease the amount of caffeine you drink. General instructions   Keep a headache journal to help find out what may trigger your headaches. For example, write down: ? What you eat and drink. ? How much sleep you get. ? Any change to your diet or medicines.  Try massage or other relaxation techniques.  Limit stress.  Sit up straight, and do not tense your muscles.  Do not use any products that contain nicotine or tobacco, such as cigarettes, e-cigarettes, and chewing tobacco. If you need help quitting, ask your health care provider.  Exercise regularly as told by your health care provider.  Sleep on a regular schedule. Get 7-9 hours of sleep  each night, or the amount recommended by your health care provider.  Keep all follow-up visits as told by your health care provider. This is important. Contact a health care provider if:  Your symptoms are not helped by medicine.  You have a headache that is different from the usual headache.  You have nausea or you vomit.  You have a fever. Get help right away if:  Your headache becomes severe quickly.  Your headache gets worse after moderate to intense physical activity.  You have repeated vomiting.  You have a stiff neck.  You have a loss of vision.  You have problems with speech.  You have pain in the eye or ear.  You have muscular weakness or loss of muscle control.  You lose your balance or have trouble walking.  You feel faint or pass out.  You have confusion.  You have a seizure. Summary  A headache is pain or discomfort felt around the head or neck area.  There are many causes and types of headaches. In some cases, the cause may not be found.  Keep a headache journal to help find out what may trigger your headaches. Watch your condition for any changes. Let your health care provider know about them.  Contact a health care provider if you have a headache that is different from the usual headache, or if your symptoms are not helped by medicine.  Get help right away if your headache becomes severe, you vomit, you have a loss of vision, you lose your balance, or you have a seizure. This information is not intended to replace advice given to you by your health care provider. Make sure you discuss any questions you have with your health care provider. Document Revised: 09/06/2017 Document Reviewed: 09/06/2017 Elsevier Patient Education  2020 [employer]vier [employer]     Venlafaxine extended-release capsules What is this medicine? VENLAFAXINE(VEN la fax een) is used to treat depression, anxiety and panic disorder. This medicine may be used for other purposes; ask  your health care provider or pharmacist if you have questions. COMMON BRAND NAME(S): Effexor XR What should I tell my health care provider before I take this medicine? They need to know if you have any of these conditions:  bleeding disorders  glaucoma  heart disease  high blood pressure  high cholesterol  kidney disease  liver disease  low levels of sodium in the blood  mania or bipolar disorder  seizures  suicidal thoughts, plans, or attempt; a previous suicide attempt by you or a family  take medicines that treat or prevent blood clots  thyroid disease  an unusual or allergic reaction to venlafaxine, desvenlafaxine, other medicines, foods, dyes, or preservatives  pregnant or trying to  get pregnant  breast-feeding How should I use this medicine? Take this medicine by mouth with a full glass of water. Follow the directions on the prescription label. Do not cut, crush, or chew this medicine. Take it with food. If needed, the capsule may be carefully opened and the entire contents sprinkled on a spoonful of cool applesauce. Swallow the applesauce/pellet mixture right away without chewing and follow with a glass of water to ensure complete swallowing of the pellets. Try to take your medicine at about the same time each day. Do not take your medicine more often than directed. Do not stop taking this medicine suddenly except upon the advice of your doctor. Stopping this medicine too quickly may cause serious side effects or your condition may worsen. A special MedGuide will be given to you by the pharmacist with each prescription and refill. Be sure to read this information carefully each time. Talk to your pediatrician regarding the use of this medicine in children. Special care may be needed. Overdosage: If you think you have taken too much of this medicine contact a poison control center or emergency room at once. NOTE: This medicine is only for you. Do not share this medicine  with others. What if I miss a dose? If you miss a dose, take it as soon as you can. If it is almost time for your next dose, take only that dose. Do not take double or extra doses. What may interact with this medicine? Do not take this medicine with any of the following medications:  certain medicines for fungal infections like fluconazole, itraconazole, ketoconazole, posaconazole, voriconazole  cisapride  desvenlafaxine  dronedarone  duloxetine  levomilnacipran  linezolid  MAOIs like Carbex, Eldepryl, Marplan, Nardil, and Parnate  methylene blue (injected into a vein)  milnacipran  pimozide  thioridazine This medicine may also interact with the following medications:  amphetamines  aspirin and aspirin-like medicines  certain medicines for depression, anxiety, or psychotic disturbances  certain medicines for migraine headaches like almotriptan, eletriptan, frovatriptan, naratriptan, rizatriptan, sumatriptan, zolmitriptan  certain medicines for sleep  certain medicines that treat or prevent blood clots like dalteparin, enoxaparin, warfarin  cimetidine  clozapine  diuretics  fentanyl  furazolidone  indinavir  isoniazid  lithium  metoprolol  NSAIDS, medicines for pain and inflammation, like ibuprofen or naproxen  other medicines that prolong the QT interval (cause an abnormal heart rhythm) like dofetilide, ziprasidone  procarbazine  rasagiline  supplements like St. John's wort, kava kava, valerian  tramadol  tryptophan This list may not describe all possible interactions. Give your health care provider a list of all the medicines, herbs, non-prescription drugs, or dietary supplements you use. Also tell them if you smoke, drink alcohol, or use illegal drugs. Some items may interact with your medicine. What should I watch for while using this medicine? Tell your doctor if your symptoms do not get better or if they get worse. Visit your doctor or  health care professional for regular checks on your progress. Because it may take several weeks to see the full effects of this medicine, it is important to continue your treatment as prescribed by your doctor. Patients and their families should watch out for new or worsening thoughts of suicide or depression. Also watch out for sudden changes in feelings such as feeling anxious, agitated, panicky, irritable, hostile, aggressive, impulsive, severely restless, overly excited and hyperactive, or not being able to sleep. If this happens, especially at the beginning of treatment or after a change in  dose, call your health care professional. This medicine can cause an increase in blood pressure. Check with your doctor for instructions on monitoring your blood pressure while taking this medicine. You may get drowsy or dizzy. Do not drive, use machinery, or do anything that needs mental alertness until you know how this medicine affects you. Do not stand or sit up quickly, especially if you are an older patient. This reduces the risk of dizzy or fainting spells. Alcohol may interfere with the effect of this medicine. Avoid alcoholic drinks. Your mouth may get dry. Chewing sugarless gum, sucking hard candy and drinking plenty of water will help. Contact your doctor if the problem does not go away or is severe. What side effects may I notice from receiving this medicine? Side effects that you should report to your doctor or health care professional as soon as possible:  allergic reactions like skin rash, itching or hives, swelling of the face, lips, or tongue  anxious  breathing problems  confusion  changes in vision  chest pain  confusion  elevated mood, decreased need for sleep, racing thoughts, impulsive behavior  eye pain  fast, irregular heartbeat  feeling faint or lightheaded, falls  feeling agitated, angry, or irritable  hallucination, loss of contact with reality  high blood  pressure  loss of balance or coordination  palpitations  redness, blistering, peeling or loosening of the skin, including inside the mouth  restlessness, pacing, inability to keep still  seizures  stiff muscles  suicidal thoughts or other mood changes  trouble passing urine or change in the amount of urine  trouble sleeping  unusual bleeding or bruising  unusually weak or tired  vomiting Side effects that usually do not require medical attention (report to your doctor or health care professional if they continue or are bothersome):  change in sex drive or performance  change in appetite or weight  constipation  dizziness  dry mouth  headache  increased sweating  nausea  tired This list may not describe all possible side effects. Call your doctor for medical advice about side effects. You may report side effects to FDA at 1-800-FDA-1088. Where should I keep my medicine? Keep out of the reach of children. Store at a controlled temperature between 20 and 25 degrees C (68 degrees and 77 degrees F), in a dry place. Throw away any unused medicine after the expiration date. NOTE: This sheet is a summary. It may not cover all possible information. If you have questions about this medicine, talk to your doctor, pharmacist, or health care provider.  2020 Elsevier/Gold Standard (2018-02-08 12:06:43)

## 2019-09-19 LAB — COMPLETE METABOLIC PANEL WITH GFR
AG Ratio: 2 (calc) (ref 1.0–2.5)
ALT: 8 U/L (ref 6–29)
AST: 15 U/L (ref 10–30)
Albumin: 4.8 g/dL (ref 3.6–5.1)
Alkaline phosphatase (APISO): 56 U/L (ref 31–125)
BUN: 10 mg/dL (ref 7–25)
CO2: 29 mmol/L (ref 20–32)
Calcium: 10.2 mg/dL (ref 8.6–10.2)
Chloride: 103 mmol/L (ref 98–110)
Creat: 0.6 mg/dL (ref 0.50–1.10)
GFR, Est African American: 147 mL/min/{1.73_m2} (ref >=60)
GFR, Est Non African American: 127 mL/min/{1.73_m2} (ref >=60)
Globulin: 2.4 g/dL (calc) (ref 1.9–3.7)
Glucose, Bld: 85 mg/dL (ref 65–99)
Potassium: 4.4 mmol/L (ref 3.5–5.3)
Sodium: 138 mmol/L (ref 135–146)
Total Bilirubin: 0.4 mg/dL (ref 0.2–1.2)
Total Protein: 7.2 g/dL (ref 6.1–8.1)

## 2019-09-19 LAB — CBC WITH DIFFERENTIAL/PLATELET
Absolute Monocytes: 511 cells/uL (ref 200–950)
Basophils Absolute: 22 cells/uL (ref 0–200)
Basophils Relative: 0.3 %
Eosinophils Absolute: 58 cells/uL (ref 15–500)
Eosinophils Relative: 0.8 %
HCT: 39.1 % (ref 35.0–45.0)
Hemoglobin: 13.6 g/dL (ref 11.7–15.5)
Lymphs Abs: 2686 cells/uL (ref 850–3900)
MCH: 31.6 pg (ref 27.0–33.0)
MCHC: 34.8 g/dL (ref 32.0–36.0)
MCV: 90.7 fL (ref 80.0–100.0)
MPV: 9.8 fL (ref 7.5–12.5)
Monocytes Relative: 7.1 %
Neutro Abs: 3924 cells/uL (ref 1500–7800)
Neutrophils Relative %: 54.5 %
Platelets: 367 10*3/uL (ref 140–400)
RBC: 4.31 10*6/uL (ref 3.80–5.10)
RDW: 12.3 % (ref 11.0–15.0)
Total Lymphocyte: 37.3 %
WBC: 7.2 10*3/uL (ref 3.8–10.8)

## 2019-09-19 LAB — URINALYSIS, ROUTINE W REFLEX MICROSCOPIC
Bilirubin Urine: NEGATIVE
Glucose, UA: NEGATIVE
Hgb urine dipstick: NEGATIVE
Ketones, ur: NEGATIVE
Leukocytes,Ua: NEGATIVE
Nitrite: NEGATIVE
Protein, ur: NEGATIVE
Specific Gravity, Urine: 1.005 (ref 1.001–1.03)
pH: 7.5 (ref 5.0–8.0)

## 2019-09-19 LAB — LIPID PANEL
Cholesterol: 178 mg/dL (ref <200)
HDL: 67 mg/dL (ref >=50)
LDL Cholesterol (Calc): 97 mg/dL (calc)
Non-HDL Cholesterol (Calc): 111 mg/dL (calc) (ref <130)
Total CHOL/HDL Ratio: 2.7 (calc) (ref <5.0)
Triglycerides: 58 mg/dL (ref <150)

## 2019-09-19 LAB — VITAMIN B12: Vitamin B-12: 316 pg/mL (ref 200–1100)

## 2019-09-19 LAB — TSH: TSH: 1.43 mIU/L

## 2019-09-19 LAB — MAGNESIUM: Magnesium: 2.1 mg/dL (ref 1.5–2.5)

## 2019-09-19 LAB — VITAMIN D 25 HYDROXY (VIT D DEFICIENCY, FRACTURES): Vit D, 25-Hydroxy: 32 ng/mL (ref 30–100)

## 2019-09-22 ENCOUNTER — Encounter: Payer: Managed Care, Other (non HMO) | Admitting: Adult Health

## 2019-10-19 ENCOUNTER — Other Ambulatory Visit: Payer: Self-pay

## 2019-10-19 DIAGNOSIS — F988 Other specified behavioral and emotional disorders with onset usually occurring in childhood and adolescence: Secondary | ICD-10-CM

## 2019-10-19 MED ORDER — AMPHETAMINE-DEXTROAMPHETAMINE 20 MG PO TABS: ORAL_TABLET | ORAL | 0 refills | Status: DC

## 2019-10-19 MED ORDER — ALPRAZOLAM 0.5 MG PO TABS: ORAL_TABLET | ORAL | 0 refills | Status: DC

## 2019-11-28 ENCOUNTER — Other Ambulatory Visit: Payer: Self-pay

## 2019-11-28 MED ORDER — LAMOTRIGINE 100 MG PO TABS: ORAL_TABLET | ORAL | 1 refills | Status: DC

## 2019-12-19 ENCOUNTER — Other Ambulatory Visit: Payer: Self-pay | Admitting: Adult Health

## 2020-01-22 ENCOUNTER — Other Ambulatory Visit: Payer: Self-pay | Admitting: Adult Health

## 2020-01-29 ENCOUNTER — Other Ambulatory Visit: Payer: Self-pay

## 2020-01-29 ENCOUNTER — Other Ambulatory Visit: Payer: Self-pay | Admitting: Adult Health

## 2020-01-29 DIAGNOSIS — F988 Other specified behavioral and emotional disorders with onset usually occurring in childhood and adolescence: Secondary | ICD-10-CM

## 2020-01-29 MED ORDER — LAMOTRIGINE 100 MG PO TABS: ORAL_TABLET | ORAL | 1 refills | Status: DC

## 2020-01-29 MED ORDER — AMPHETAMINE-DEXTROAMPHETAMINE 20 MG PO TABS: ORAL_TABLET | ORAL | 0 refills | Status: DC

## 2020-02-22 ENCOUNTER — Other Ambulatory Visit: Payer: Self-pay

## 2020-02-22 ENCOUNTER — Emergency Department (HOSPITAL_COMMUNITY): Payer: Managed Care, Other (non HMO)

## 2020-02-22 ENCOUNTER — Emergency Department (HOSPITAL_COMMUNITY)
Admission: EM | Admit: 2020-02-22 | Discharge: 2020-02-22 | Disposition: A | Discharge status: Home / Self Care | Payer: Managed Care, Other (non HMO) | Attending: Emergency Medicine | Admitting: Emergency Medicine

## 2020-02-22 DIAGNOSIS — Z8616 Personal history of COVID-19: Secondary | ICD-10-CM | POA: Diagnosis not present

## 2020-02-22 DIAGNOSIS — R072 Precordial pain: Secondary | ICD-10-CM | POA: Insufficient documentation

## 2020-02-22 DIAGNOSIS — Z91010 Allergy to peanuts: Secondary | ICD-10-CM | POA: Insufficient documentation

## 2020-02-22 DIAGNOSIS — R079 Chest pain, unspecified: Secondary | ICD-10-CM

## 2020-02-22 LAB — CBC
HCT: 40 % (ref 36.0–46.0)
Hemoglobin: 13.7 g/dL (ref 12.0–15.0)
MCH: 30.7 pg (ref 26.0–34.0)
MCHC: 34.3 g/dL (ref 30.0–36.0)
MCV: 89.7 fL (ref 80.0–100.0)
Platelets: 391 10*3/uL (ref 150–400)
RBC: 4.46 MIL/uL (ref 3.87–5.11)
RDW: 12 % (ref 11.5–15.5)
WBC: 5.9 10*3/uL (ref 4.0–10.5)
nRBC: 0 % (ref 0.0–0.2)

## 2020-02-22 LAB — D-DIMER, QUANTITATIVE: D-Dimer, Quant: 0.27 ug/mL-FEU (ref 0.00–0.50)

## 2020-02-22 LAB — BASIC METABOLIC PANEL
Anion gap: 12 (ref 5–15)
BUN: 6 mg/dL (ref 6–20)
CO2: 26 mmol/L (ref 22–32)
Calcium: 9.9 mg/dL (ref 8.9–10.3)
Chloride: 100 mmol/L (ref 98–111)
Creatinine, Ser: 0.72 mg/dL (ref 0.44–1.00)
GFR, Estimated: >60 mL/min (ref >60)
Glucose, Bld: 94 mg/dL (ref 70–99)
Potassium: 3.4 mmol/L — ABNORMAL LOW (ref 3.5–5.1)
Sodium: 138 mmol/L (ref 135–145)

## 2020-02-22 LAB — TROPONIN I (HIGH SENSITIVITY)
Troponin I (High Sensitivity): <2 ng/L (ref <18)
Troponin I (High Sensitivity): <2 ng/L (ref <18)

## 2020-02-22 LAB — I-STAT BETA HCG BLOOD, ED (MC, WL, AP ONLY): I-stat hCG, quantitative: <5 m[IU]/mL (ref <5)

## 2020-02-22 MED ORDER — KETOROLAC TROMETHAMINE 30 MG/ML IJ SOLN
30.0000 mg | Freq: Once | INTRAMUSCULAR | Status: DC
  Filled 2020-02-22: qty 1

## 2020-02-22 NOTE — ED Notes (Signed)
Discharge instructions provided to patient. Verbalized understanding. Alert and oriented. Ambulated with steady gait out of ED. 

## 2020-02-22 NOTE — ED Provider Notes (Signed)
[facility] EMERGENCY DEPARTMENT Provider Note   CSN: [id number] Arrival date & time: 02/22/20  1557     History Chief Complaint  Patient presents with   Chest Pain    Hannah Reeves is a 24 y.o. female.  HPI   24 year old female with past medical history of anxiety, depression, migraines who is Covid positive presents the emergency department with midsternal chest pain.  Patient states that this has been intermittent for the past 3 days.  Sharp in nature, brief, self resolves.  She denies any shortness of breath, cough.  No lower extremity swelling.  Her main symptoms from Covid have been sore throat, headache and congestion.  Past Medical History:  Diagnosis Date   Anxiety    Depression    History of traumatic brain injury    IBS (irritable bowel syndrome)    no current med.   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/2018   Migraine without aura    ADD (attention deficit disorder) 11/07/2013   Bipolar depression (HCC) 11/07/2013   Asthma    IBS (irritable bowel syndrome)    H/O cold sores     Past Surgical History:  Procedure Laterality Date   INTRAUTERINE DEVICE (IUD) INSERTION     02-27-15 paragard insertion   TONSILLECTOMY N/A 11/11/2016   Procedure: TONSILLECTOMY;  Surgeon: Serena Colonel, MD;  Location: [facility];  Service: ENT;  Laterality: N/A;   WISDOM TOOTH EXTRACTION       OB History    Gravida  0   Para  0   Term  0   Preterm  0   AB  0    Living  0     SAB  0   IAB  0   Ectopic  0   Multiple  0   Live Births              Family History  Problem Relation Age of Onset   Allergies Mother    Stroke Paternal Grandfather    Aneurysm Paternal Grandfather    Anuerysm Father     Social History   Tobacco Use   Smoking status: Never Smoker   Smokeless tobacco: Never Used  Vaping Use   Vaping Use: Never used  Substance Use Topics   Alcohol use: Yes    Alcohol/week: 2.0 standard drinks    Types: 2 Glasses of wine per week   Drug use: Not Currently    Types: Marijuana    Home Medications Prior to Admission medications   Medication Sig Start Date End Date Taking? Authorizing Provider  acetaminophen (TYLENOL) 325 MG tablet Take 650 mg by mouth every 6 (six) hours as needed (pain).   Yes [provider]  ALPRAZolam (XANAX) 0.5 MG tablet TAKE 1/2 TO 1 TABLET 2 - 3 TIMES DAILY ONLY IF NEEDED FOR SEVERE ANXIETY/PANIC *MAX 5 DAYS PER WEEK* 01/23/20  Yes Judd Gaudier, NP  amphetamine-dextroamphetamine (ADDERALL) 20 MG tablet Take 1/2-1 tablet 1-2 x /day for ADD- max 5 days /  week to avoid addiction Patient taking differently: Take 20 mg by mouth See admin instructions. Take Monday through Friday. Take 1/2-1 tablet 1-2 times a day for ADD; max 5 days a week to avoid addiction. 01/29/20  Yes Judd Gaudier, NP  cetirizine (ZYRTEC) 10 MG tablet Take 10 mg by mouth as needed for allergies.   Yes [provider]  lamoTRIgine (LAMICTAL) 100 MG tablet Take 1 tablet 2 x /day for Mood Patient taking differently: Take 100 mg by mouth 2 (two) times daily. 01/29/20  Yes Judd Gaudier, NP    Allergies    Peanut-containing drug products, Shellfish allergy, Penicillins, and Sulfa antibiotics  Review of Systems   Review of Systems  Constitutional: Negative for chills and fever.  HENT: Negative for congestion.   Eyes: Negative for visual disturbance.  Respiratory: Negative for chest tightness and  shortness of breath.   Cardiovascular: Positive for chest pain. Negative for leg swelling.  Gastrointestinal: Negative for abdominal pain, diarrhea and vomiting.  Genitourinary: Negative for dysuria.  Skin: Negative for rash.  Neurological: Negative for headaches.    Physical Exam Updated Vital Signs BP 121/76    Pulse 89    Temp 98.7 F (37.1 C) (Oral)    Resp (!) 21    Ht 5\' 3"  (1.6 m)    Wt 52.2 kg    SpO2 100%    BMI 20.37 kg/m   Physical Exam Vitals and nursing note reviewed.  Constitutional:      Appearance: Normal appearance.  HENT:     Head: Normocephalic.     Mouth/Throat:     Mouth: Mucous membranes are moist.  Cardiovascular:     Rate and Rhythm: Normal rate.  Pulmonary:     Effort: Pulmonary effort is normal. No respiratory distress.  Abdominal:     Palpations: Abdomen is soft.     Tenderness: There is no abdominal tenderness.  Musculoskeletal:     Right lower leg: No edema.     Left lower leg: No edema.  Skin:    General: Skin is warm.  Neurological:     Mental Status: She is alert and oriented to person, place, and time. Mental status is at baseline.  Psychiatric:        Mood and Affect: Mood normal.     ED Results / Procedures / Treatments   Labs (all labs ordered are listed, but only abnormal results are displayed) Labs Reviewed  BASIC METABOLIC PANEL - Abnormal; Notable for the following components:      Result Value   Potassium 3.4 (*)    All other components within normal limits  CBC  D-DIMER, QUANTITATIVE (NOT AT [facility])  I-STAT BETA HCG BLOOD, ED ([facility], [facility], [facility] ONLY)  TROPONIN I (HIGH SENSITIVITY)  TROPONIN I (HIGH SENSITIVITY)    EKG EKG Interpretation  Date/Time:  Thursday February 22 2020 16:08:02 EST Ventricular Rate:  102 PR Interval:  118 QRS Duration: 78 QT Interval:  344 QTC Calculation: 448 R Axis:   95 Text Interpretation: Sinus tachycardia Rightward axis Borderline ECG Sinus Tachycardia Confirmed by 1980 [phone]) on  02/22/2020 7:30:28 PM   Radiology DG Chest 1 View  Result Date: 02/22/2020 CLINICAL DATA:  Left-sided chest pain EXAM: CHEST  1 VIEW COMPARISON:  None. FINDINGS: Midline trachea. Normal heart size and mediastinal contours. No pleural effusion or pneumothorax. Clear lungs. IMPRESSION: No acute cardiopulmonary disease. Electronically Signed   By: 02/24/2020 M.D.   On: 02/22/2020 19:49    Procedures  Procedures (including critical care time)  Medications Ordered in ED Medications  ketorolac (TORADOL) 30 MG/ML injection 30 mg (30 mg Intravenous Patient Refused/Not Given 02/22/20 2013)    ED Course  I have reviewed the triage vital signs and the nursing notes.  Pertinent labs & imaging results that were available during my care of the patient were reviewed by me and considered in my medical decision making (see chart for details).    MDM Rules/Calculators/A&P                          Patient is sitting up, conversational and comfortable.  No active chest pain.  No signs of shortness of breath.  EKG was sinus tachycardia, no ischemic changes.  On my evaluation patient has a normal heart rate without any intervention.  We will do a cardiac work-up and perform D-dimer to rule out PE.  D-dimer was negative, troponin was negative x2, chest x-ray shows no acute finding.  On reevaluation patient is chest pain-free.  Patient was educated on Covid and the treatment.  Patient will be discharged and treated as an outpatient.  Discharge plan discussed, patient verbalizes understanding and agreement.  Final Clinical Impression(s) / ED Diagnoses Final diagnoses:  None    Rx / DC Orders ED Discharge Orders    None       Rozelle Logan, DO 02/22/20 2242

## 2020-02-22 NOTE — ED Triage Notes (Addendum)
Pt presents to ED POV. Pt c/o CP that began x3 days ago. Pt reports that she tested positive for covid 12/18. Pt is vaccinated

## 2020-02-22 NOTE — Discharge Instructions (Addendum)
You have been seen and discharged from the emergency department.  Follow-up with your primary provider for reevaluation. Take home medications as prescribed. If you have any worsening symptoms or further concerns for health please return to emergency department for further evaluation. 

## 2020-03-22 NOTE — Progress Notes (Signed)
6 month follow up  Assessment and Plan:  Bipolar depression type 2/anxiety/ADD Depression with hypomanic episdoes, worse with zoloft  Improved with lamictal Have recommended psych eval but had postponed due to patient preference with pandemic She has new insurance next month and plans to pursue then May benefit from transition to newer agent such as [employer];  Have discussed that lamictal is stabilizer will NOT help with anxiety; overall seems to be improving with lifestyle modification, improved sleep and counseling. Xanax is working well for her; have discussed limiting use <5 days/week Benadryl is working well for her in the evenings for sleep.  she prefers to continue with current adderall for ADD despite discussion that this may be aggravating anxiety. Limit use and lowest dose necessary. Taking occasionally as needed for work, <5 days/week -     lamoTRIgine (LAMICTAL) 100 MG tablet; Take 2 tablet (200 mg total) by mouth daily -     ALPRAZolam (XANAX) 0.5 MG tablet; Take 1/2-1 tab of xanax up to 3 times a day as needed for panic attacks or severe anxiety. -     amphetamine-dextroamphetamine (ADDERALL) 20 MG tablet; Take 1/2-1 tablet 1-2 x /day for ADD- max 5 days /week to avoid addiction  Migraines  Recently fairly controlled with lifestyle, PRN OTC analgesic  Lifestyle reviewed  Family history cerebral aneurysm She will follow up with neuro for specific recommendations; discussed MRA head; she is getting new insurance and requests to defer;  Well controlled BP; ER precautions if any sudden/severe HA   Further disposition pending results of labs. Discussed med's effects and SE's.   Over 30 minutes of exam, counseling, chart review, and critical decision making was performed.   Future Appointments  Date Time Provider Department Center  09/26/2020  2:00 PM Judd Gaudier, NP [facility] None      ------------------------------------------------------------------------------------------------------------------  HPI BP 108/66   Pulse 82   Temp (!) 96.4 F (35.8 C)   Wt 116 lb (52.6 kg)   SpO2 99%   BMI 20.55 kg/m   24 y.o.female with hx of migraines, TBI presents for 6  month follow up for depression/anxiety, ADD and migraines.   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 She did not enjoy previous work, has new job starting on Monday, will be working in [employer], working remote with flexible hours.   Dad had cerebral aneurysm, ? Genetic component and was advised may need to start screening with MRAs at some point by specialist, PGF also had, unsure of details, she will clarify, has new insurance starting next month and wanting to postpone any referral or imaging at this time.   She was initiated last year on lamotrigine due to labile mood and depressive episodes with suspicion for type 2 bipolar disorder. Taking 200 mg daily with perceived benefit, improved mood, less lability, fewer outbursts. She was recommended psych evaluation, trying to find provider with new insurance starting next month. She was working with a therapist, had been beneficial but stopped with dad's health situation, wants to find someone with more experience.   She has been on adderall for ADD for many years; takes as needed for work. She is aware this may make anxiety worse, but feels strongly she needs for mood and focus. Hasn't noted worse anxiety or insomnia on days she takes.   Takes PRN xanax for anxiety, didn't perceive benefit with buspar, recently was  needing more in the evening as she finishes up stressful job position, would take 1/2 tab and second half only if needed. Didn't do well with trazodone; also takes benadryl 25-50 mg for sleep as needed.   She has reported migraines  which were severe as a teen, formerly on topamax, was having more headaches with work stress, every other day, start in afternoons but variable between unilateral "migraine" with nausea and tension in neck. Also with TMJ. Reports improving in last month. Taking tylenol and lying down in dark room with variable benefit.   She is trying to work on lifestyle, getting out to exercise and walk as much as possible but typically tired after work, cooking healthy meals at home for family. She is staying at home with parents and brother. She calls a friend who is positive and supportive with moods. Denies any drug use, reports rare alcohol with dinner. Planning to start daily yoga with new job.    BMI is Body mass index is 20.55 kg/m. Wt Readings from Last 3 Encounters:  03/25/20 116 lb (52.6 kg)  02/22/20 115 lb (52.2 kg)  09/18/19 112 lb 12.8 oz (51.2 kg)     Past Medical History:  Diagnosis Date  . Anxiety   . Depression   . History of traumatic brain injury   . IBS (irritable bowel syndrome)    no current med.  . Migraines   . 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 S/P tonsillectomy 11/11/2016  . Seasonal allergies      Allergies  Allergen Reactions  . Peanut-Containing Drug Products Shortness Of Breath and Itching    TREE NUTS - ITCHING OF MOUTH   . Shellfish Allergy Shortness Of Breath and Itching    ITCHING OF MOUTH  . Penicillins Hives  . Sulfa Antibiotics Hives    Current Outpatient Medications on File Prior to Visit  Medication Sig  . acetaminophen (TYLENOL) 325 MG tablet Take 650 mg by mouth every 6 (six) hours as needed (pain).  Marland Kitchen ALPRAZolam (XANAX) 0.5 MG tablet TAKE 1/2 TO 1 TABLET 2 - 3 TIMES DAILY ONLY IF NEEDED FOR SEVERE ANXIETY/PANIC *MAX 5 DAYS PER WEEK*  . amphetamine-dextroamphetamine (ADDERALL) 20 MG tablet Take 1/2-1 tablet 1-2 x /day for ADD- max 5 days /week to avoid addiction (Patient taking differently: Take 20 mg by mouth See admin instructions.  Take Monday through Friday. Take 1/2-1 tablet 1-2 times a day for ADD; max 5 days a week to avoid addiction.)  . cetirizine (ZYRTEC) 10 MG tablet Take 10 mg by mouth as needed for allergies.  Marland Kitchen lamoTRIgine (LAMICTAL) 100 MG tablet Take 1 tablet 2 x /day for Mood (Patient taking differently: Take 100 mg by mouth 2 (two) times daily.)   No current facility-administered medications on file prior to visit.    ROS: Review of Systems  Constitutional: Negative for malaise/fatigue and weight loss.  HENT: Negative for hearing loss and tinnitus.   Eyes: Negative for blurred vision and double vision.  Respiratory: Negative for cough, shortness of breath and wheezing.   Cardiovascular: Negative for chest pain, palpitations, orthopnea, claudication and leg swelling.  Gastrointestinal: Negative for abdominal pain, blood in stool, constipation, diarrhea, heartburn, melena, nausea and vomiting.  Genitourinary: Negative.   Musculoskeletal: Negative for joint pain and myalgias.  Skin: Negative for rash.  Neurological: Positive for headaches (migraines monthly ). Negative for dizziness, tingling, sensory change and weakness.  Endo/Heme/Allergies: Negative for polydipsia.  Psychiatric/Behavioral: Negative for depression, hallucinations, memory loss, substance abuse  and suicidal ideas. The patient is nervous/anxious and has insomnia.   All other systems reviewed and are negative.     Physical Exam:  BP 108/66   Pulse 82   Temp (!) 96.4 F (35.8 C)   Wt 116 lb (52.6 kg)   SpO2 99%   BMI 20.55 kg/m   General Appearance: Well nourished, in no acute distress Eyes: PERRLA, EOMs, conjunctiva no swelling or erythema Sinuses: No Frontal/maxillary tenderness ENT/Mouth: Ext aud canals clear, TMs without erythema, bulging. No erythema, swelling, or exudate on post pharynx.  Tonsils not swollen or erythematous. Hearing normal.  Neck: Supple, thyroid normal.  Respiratory: Respiratory effort normal, BS equal  bilaterally without rales, rhonchi, wheezing or stridor.  Cardio: RRR with no MRGs. Brisk peripheral pulses without edema.  Abdomen: Soft, + BS.  Non tender. Lymphatics: Non tender without lymphadenopathy.  Musculoskeletal: Full ROM, 5/5 strength, normal gait.  Skin: Warm, dry without rashes, lesions, ecchymosis.  Neuro: Cranial nerves intact. Normal muscle tone, no cerebellar symptoms. Sensation intact.  Psych:  Insight and Judgment appropriate. normal affect, non-pressured speech.     Dan Maker, NP 2:40 PM [facility]

## 2020-03-25 ENCOUNTER — Ambulatory Visit (INDEPENDENT_AMBULATORY_CARE_PROVIDER_SITE_OTHER): Payer: Managed Care, Other (non HMO) | Admitting: Adult Health

## 2020-03-25 ENCOUNTER — Encounter: Payer: Self-pay | Admitting: Adult Health

## 2020-03-25 ENCOUNTER — Other Ambulatory Visit: Payer: Self-pay

## 2020-03-25 VITALS — BP 108/66 | HR 82 | Temp 96.4°F | Wt 116.0 lb

## 2020-03-25 DIAGNOSIS — F411 Generalized anxiety disorder: Secondary | ICD-10-CM

## 2020-03-25 DIAGNOSIS — G43009 Migraine without aura, not intractable, without status migrainosus: Secondary | ICD-10-CM

## 2020-03-25 DIAGNOSIS — F988 Other specified behavioral and emotional disorders with onset usually occurring in childhood and adolescence: Secondary | ICD-10-CM | POA: Diagnosis not present

## 2020-03-25 DIAGNOSIS — Z681 Body mass index (BMI) 19 or less, adult: Secondary | ICD-10-CM

## 2020-03-25 DIAGNOSIS — F319 Bipolar disorder, unspecified: Secondary | ICD-10-CM | POA: Diagnosis not present

## 2020-03-25 DIAGNOSIS — J4520 Mild intermittent asthma, uncomplicated: Secondary | ICD-10-CM

## 2020-03-25 DIAGNOSIS — F409 Phobic anxiety disorder, unspecified: Secondary | ICD-10-CM

## 2020-03-25 DIAGNOSIS — F5105 Insomnia due to other mental disorder: Secondary | ICD-10-CM

## 2020-03-25 MED ORDER — AMPHETAMINE-DEXTROAMPHETAMINE 20 MG PO TABS
20.0000 mg | ORAL_TABLET | ORAL | 0 refills | Status: DC
Start: 2020-03-25 — End: 2020-07-01

## 2020-03-25 MED ORDER — ALPRAZOLAM 0.5 MG PO TABS: ORAL_TABLET | ORAL | 0 refills | Status: DC

## 2020-03-25 MED ORDER — LAMOTRIGINE 100 MG PO TABS: ORAL_TABLET | ORAL | 1 refills | Status: DC

## 2020-04-16 NOTE — Progress Notes (Deleted)
25 y.o. G0P0000 Single White or Caucasian female here for annual exam.      No LMP recorded. (Menstrual status: IUD).          Sexually active: {yes no:314532}  The current method of family planning is paragard iud inserted 02-27-15.    Exercising: {yes no:314532}  {types:19826} Smoker:  {YES NO:22349}  Health Maintenance: Pap:  07-20-2018 neg History of abnormal Pap:  yes MMG:  none Colonoscopy:  none BMD:   none TDaP:  2014 Gardasil:   Had 1? Covid-19: pfizer Hep C testing: neg 2020 Screening Labs: ***   reports that she has never smoked. She has never used smokeless tobacco. She reports current alcohol use of about 2.0 standard drinks of alcohol per week. She reports previous drug use. Drug: Marijuana.  Past Medical History:  Diagnosis Date  . Anxiety   . Depression   . History of traumatic brain injury   . IBS (irritable bowel syndrome)    no current med.  . Migraines   . 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 S/P tonsillectomy 11/11/2016  . Seasonal allergies     Past Surgical History:  Procedure Laterality Date  . INTRAUTERINE DEVICE (IUD) INSERTION     02-27-15 paragard insertion  . TONSILLECTOMY N/A 11/11/2016   Procedure: TONSILLECTOMY;  Surgeon: Serena Colonel, MD;  Location: [facility];  Service: ENT;  Laterality: N/A;  . WISDOM TOOTH EXTRACTION      Current Outpatient Medications  Medication Sig Dispense Refill  . acetaminophen (TYLENOL) 325 MG tablet Take 650 mg by mouth every 6 (six) hours as needed (pain).    Marland Kitchen ALPRAZolam (XANAX) 0.5 MG tablet TAKE 1/2 TO 1 TABLET 2 - 3 TIMES DAILY ONLY IF NEEDED FOR SEVERE ANXIETY/PANIC *MAX 5 DAYS PER WEEK* 30 tablet 0  . amphetamine-dextroamphetamine (ADDERALL) 20 MG tablet Take 1 tablet (20 mg total) by mouth See admin instructions. Take Monday through Friday. Take 1/2-1 tablet 1-2 times a day for ADD; max 5 days a week to avoid addiction. 60 tablet 0  . cetirizine (ZYRTEC) 10 MG tablet Take 10 mg  by mouth as needed for allergies.    Marland Kitchen lamoTRIgine (LAMICTAL) 100 MG tablet Take 1 tablet 2 x /day for Mood 180 tablet 1   No current facility-administered medications for this visit.    Family History  Problem Relation Age of Onset  . Allergies Mother   . Stroke Paternal Grandfather   . Aneurysm Paternal Grandfather   . Anuerysm Father     Review of Systems  Exam:   There were no vitals taken for this visit.     General appearance: alert, cooperative and appears stated age, no acute distress Head: Normocephalic, without obvious abnormality Neck: no adenopathy, thyroid {EXAM; THYROID:18604} Lungs: clear to auscultation bilaterally Breasts: {Exam; breast:13139::"normal appearance, no masses or tenderness"} Heart: regular rate and rhythm Abdomen: soft, non-tender; no masses,  no organomegaly Extremities: extremities normal, no edema Skin: No rashes or lesions Lymph nodes: Cervical, supraclavicular, and axillary nodes normal. No abnormal inguinal nodes palpated Neurologic: Grossly normal   Pelvic: External genitalia:  no lesions              Urethra:  normal appearing urethra with no masses, tenderness or lesions              Bartholins and Skenes: normal                 Vagina: normal appearing vagina, appropriate for age, normal  appearing discharge, no lesions              Cervix: neg cervical motion tenderness, no visible lesions             Bimanual Exam:   Uterus:  {exam; uterus:12215}              Adnexa: {exam; adnexa:12223}                 ***, CMA Chaperone was present for exam.  A:  Well Woman with normal exam  P:   Pap :  Mammogram:  Labs:  Medications:

## 2020-04-18 ENCOUNTER — Ambulatory Visit: Payer: Managed Care, Other (non HMO) | Admitting: Nurse Practitioner

## 2020-04-22 NOTE — Progress Notes (Signed)
25 y.o. G0P0000 Single White or Caucasian female here for annual exam.    Noticed had some cramps and pain recently, first noticed Feb 5. Pain was during sex. Subsequent sex did not cause pain.Denies abnormal discharge, itching or irritation, no odor.  Denies abnormal bleeding. Has been feeling more crampy lately as well. Declines STD testing.  Menses come monthly (q27 days) lasting 5 days, first 1-2 days is heavy.   Works in [employer] based in [employer], just stared new job this week.   Patient's last menstrual period was 04/05/2020 (exact date).          Sexually active: Yes.    The current method of family planning is IUD.   paragard inseted 02-27-15 Exercising: Yes.    walking & some cardio Smoker:  no  Health Maintenance: Pap: 03-27-16 neg, 07-20-2018 neg History of abnormal Pap:  yes MMG:  none Colonoscopy:  none BMD:   none TDaP:  2014 Gardasil:   Completed per patient Covid-19: pfizer Hep C testing: neg 2020    reports that she has never smoked. She has never used smokeless tobacco. She reports current alcohol use of about 2.0 standard drinks of alcohol per week. She reports previous drug use.  Past Medical History:  Diagnosis Date  . Anxiety   . Depression   . History of traumatic brain injury   . IBS (irritable bowel syndrome)    no current med.  . Migraines   . 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 S/P tonsillectomy 11/11/2016  . Seasonal allergies     Past Surgical History:  Procedure Laterality Date  . INTRAUTERINE DEVICE (IUD) INSERTION     02-27-15 paragard insertion  . TONSILLECTOMY N/A 11/11/2016   Procedure: TONSILLECTOMY;  Surgeon: Serena Colonel, MD;  Location: [facility];  Service: ENT;  Laterality: N/A;  . WISDOM TOOTH EXTRACTION      Current Outpatient Medications  Medication Sig Dispense Refill  . acetaminophen (TYLENOL) 325 MG tablet Take 650 mg by mouth every 6 (six) hours as needed (pain).    Marland Kitchen ALPRAZolam (XANAX) 0.5  MG tablet TAKE 1/2 TO 1 TABLET 2 - 3 TIMES DAILY ONLY IF NEEDED FOR SEVERE ANXIETY/PANIC *MAX 5 DAYS PER WEEK* 30 tablet 0  . amphetamine-dextroamphetamine (ADDERALL) 20 MG tablet Take 1 tablet (20 mg total) by mouth See admin instructions. Take Monday through Friday. Take 1/2-1 tablet 1-2 times a day for ADD; max 5 days a week to avoid addiction. 60 tablet 0  . cetirizine (ZYRTEC) 10 MG tablet Take 10 mg by mouth as needed for allergies.    Marland Kitchen lamoTRIgine (LAMICTAL) 100 MG tablet Take 1 tablet 2 x /day for Mood 180 tablet 1   No current facility-administered medications for this visit.    Family History  Problem Relation Age of Onset  . Allergies Mother   . Stroke Paternal Grandfather   . Aneurysm Paternal Grandfather   . Anuerysm Father     Review of Systems  Constitutional: Negative.   HENT: Negative.   Eyes: Negative.   Respiratory: Negative.   Cardiovascular: Negative.   Gastrointestinal: Negative.   Endocrine: Negative.   Genitourinary: Negative.   Musculoskeletal: Negative.   Skin: Negative.   Allergic/Immunologic: Negative.   Neurological: Negative.   Hematological: Negative.   Psychiatric/Behavioral: Negative.     Exam:   BP 94/64   Pulse 80   Resp 16   Ht 5' 3.25" (1.607 m)   Wt 113 lb (51.3 kg)   LMP  04/05/2020 (Exact Date)   BMI 19.86 kg/m   Height: 5' 3.25" (160.7 cm)  General appearance: alert, cooperative and appears stated age, no acute distress Head: Normocephalic, without obvious abnormality Neck: no adenopathy, thyroid normal to inspection and palpation Lungs: clear to auscultation bilaterally Breasts: Normal to palpation without dominant masses, fibrocystic, many densities Heart: regular rate and rhythm Abdomen: soft, non-tender; no masses,  no organomegaly Extremities: extremities normal, no edema Skin: No rashes or lesions Lymph nodes: Cervical, supraclavicular, and axillary nodes normal. No abnormal inguinal nodes palpated Neurologic:  Grossly normal   Pelvic: External genitalia:  no lesions              Urethra:  normal appearing urethra with no masses, tenderness or lesions              Bartholins and Skenes: normal                 Vagina: normal appearing vagina, appropriate for age, normal appearing discharge, no lesions              Cervix: neg cervical motion tenderness, no visible lesions, Paragard IUD strings visible             Bimanual Exam:   Uterus:  normal size, contour, position, consistency, mobility, non-tender              Adnexa: normal adnexa                 Joy, CMA Chaperone was present for exam.  A/P:  Well woman exam with routine gynecological exam  Surveillance of intrauterine contraceptive device, inserted 02/27/2015, due out 02/26/2025  Uterine cramping - Plan: ibuprofen (ADVIL) 600 MG tablet  Fibrocystic breast changes of both breasts, multiple densities, no dominant mass, however difficult exam. Encouraged repeat breast exam in 6 weeeks (in a different place in her cycle)   Pap : last done 07/2018, due 06/2021  Labs: declines STD testing  Medications: Ibuprofen  F/U 6 week breast check

## 2020-04-23 ENCOUNTER — Ambulatory Visit (INDEPENDENT_AMBULATORY_CARE_PROVIDER_SITE_OTHER): Payer: Managed Care, Other (non HMO) | Admitting: Nurse Practitioner

## 2020-04-23 ENCOUNTER — Other Ambulatory Visit: Payer: Self-pay

## 2020-04-23 ENCOUNTER — Encounter: Payer: Self-pay | Admitting: Nurse Practitioner

## 2020-04-23 VITALS — BP 94/64 | HR 80 | Resp 16 | Ht 63.25 in | Wt 113.0 lb

## 2020-04-23 DIAGNOSIS — N6012 Diffuse cystic mastopathy of left breast: Secondary | ICD-10-CM

## 2020-04-23 DIAGNOSIS — Z30431 Encounter for routine checking of intrauterine contraceptive device: Secondary | ICD-10-CM | POA: Diagnosis not present

## 2020-04-23 DIAGNOSIS — N6011 Diffuse cystic mastopathy of right breast: Secondary | ICD-10-CM | POA: Diagnosis not present

## 2020-04-23 DIAGNOSIS — N9489 Other specified conditions associated with female genital organs and menstrual cycle: Secondary | ICD-10-CM

## 2020-04-23 DIAGNOSIS — Z01419 Encounter for gynecological examination (general) (routine) without abnormal findings: Secondary | ICD-10-CM | POA: Diagnosis not present

## 2020-04-23 MED ORDER — IBUPROFEN 600 MG PO TABS: 600.0000 mg | ORAL_TABLET | Freq: Three times a day (TID) | ORAL | 1 refills | Status: DC | PRN

## 2020-04-23 NOTE — Patient Instructions (Signed)
Fibrocystic Breast Changes  Fibrocystic breast changes are changes in breast tissue that can cause breasts to become swollen, lumpy, or painful. This can happen due to buildup of scar-like tissue (fibrous tissue) or the forming of fluid-filled lumps (cysts) in the breast. Fibrocystic breast changes can affect one or both breasts. The condition is common, and it is not cancer. What are the causes? The exact cause of fibrocystic breast changes is not known. However, this condition may be:  Related to the female hormones estrogen and progesterone.  Influenced by family traits that get passed from parent to child (inherited). What are the signs or symptoms? Symptoms of this condition include:  Tenderness, swelling, mild discomfort, or pain.  Rope-like tissue that can be felt when touching the breast.  Lumps in one or both breasts.  Changes in breast size. Breasts may get larger before a menstrual period and smaller after a menstrual period.  Discharge from the nipple. Symptoms of this condition may affect one or both breasts and are usually worse before menstrual periods start. Symptoms usually get better toward the end of menstrual periods. How is this diagnosed? This condition is diagnosed based on your medical history and a physical exam of your breasts. You may also have tests, such as:  A breast X-ray (mammogram).  Ultrasound.  MRI.  Removing a small sample of tissue from the breast for tests (breast biopsy). This may be done if your health care provider thinks that something else may be causing changes in your breasts. How is this treated? Often, treatment is not needed for this condition. In some cases, however, treatment may be needed, including:  Taking over-the-counter pain medicines to help relieve pain or discomfort.  Limiting or avoiding caffeine. Foods and beverages that contain caffeine include chocolate, soda, coffee, and tea.  Reducing sugar and fat in your  diet. Treatment may also include:  A procedure to remove fluid from a cyst that is causing pain (fine needle aspiration).  Surgery to remove a cyst that is large or tender or does not go away.  Medicines that may lower the amount of female hormones. Follow these instructions at home: Self care  Check your breasts after every menstrual period. If you do not have menstrual periods, check your breasts on the first day of every month. Feel for changes in your breasts, such as: ? More tenderness. ? A new growth. ? A change in size. ? A change in an existing lump. General instructions  Take over-the-counter and prescription medicines only as told by your health care provider.  Wear a well-fitting support or sports bra, especially when exercising.  If told by your health care provider, decrease or avoid caffeine, fat, and sugar in your diet.  Keep all follow-up visits as told by your health care provider. This is important. Contact a health care provider if:  You have fluid leaking from your nipple, especially if it is bloody.  You have new lumps or bumps in your breast.  Your breast becomes enlarged, red, and painful.  You have areas of your breast that pucker inward.  Your nipple appears flat or indented. Get help right away if:  You have redness of your breast and the redness is spreading. Summary  Fibrocystic breast changes are changes in breast tissue that can cause breasts to become swollen, lumpy, or painful.  This condition may be related to the female hormones estrogen and progesterone.  With this condition, it is important to examine your breasts after every  menstrual period. If you do not have menstrual periods, check your breasts on the first day of every month. This information is not intended to replace advice given to you by your health care provider. Make sure you discuss any questions you have with your health care provider. Document Revised: 01/30/2019  Document Reviewed: 01/30/2019 Elsevier Patient Education  2021 [employer] Inc.   Health Maintenance, Female Adopting a healthy lifestyle and getting preventive care are important in promoting health and wellness. Ask your health care provider about:  The right schedule for you to have regular tests and exams.  Things you can do on your own to prevent diseases and keep yourself healthy. What should I know about diet, weight, and exercise? Eat a healthy diet  Eat a diet that includes plenty of vegetables, fruits, low-fat dairy products, and lean protein.  Do not eat a lot of foods that are high in solid fats, added sugars, or sodium.   Maintain a healthy weight Body mass index (BMI) is used to identify weight problems. It estimates body fat based on height and weight. Your health care provider can help determine your BMI and help you achieve or maintain a healthy weight. Get regular exercise Get regular exercise. This is one of the most important things you can do for your health. Most adults should:  Exercise for at least 150 minutes each week. The exercise should increase your heart rate and make you sweat (moderate-intensity exercise).  Do strengthening exercises at least twice a week. This is in addition to the moderate-intensity exercise.  Spend less time sitting. Even light physical activity can be beneficial. Watch cholesterol and blood lipids Have your blood tested for lipids and cholesterol at 26 years of age, then have this test every 5 years. Have your cholesterol levels checked more often if:  Your lipid or cholesterol levels are high.  You are older than 26 years of age.  You are at high risk for heart disease. What should I know about cancer screening? Depending on your health history and family history, you may need to have cancer screening at various ages. This may include screening for:  Breast cancer.  Cervical cancer.  Colorectal cancer.  Skin  cancer.  Lung cancer. What should I know about heart disease, diabetes, and high blood pressure? Blood pressure and heart disease  High blood pressure causes heart disease and increases the risk of stroke. This is more likely to develop in people who have high blood pressure readings, are of African descent, or are overweight.  Have your blood pressure checked: ? Every 3-5 years if you are 6-49 years of age. ? Every year if you are 64 years old or older. Diabetes Have regular diabetes screenings. This checks your fasting blood sugar level. Have the screening done:  Once every three years after age 39 if you are at a normal weight and have a low risk for diabetes.  More often and at a younger age if you are overweight or have a high risk for diabetes. What should I know about preventing infection? Hepatitis B If you have a higher risk for hepatitis B, you should be screened for this virus. Talk with your health care provider to find out if you are at risk for hepatitis B infection. Hepatitis C Testing is recommended for:  Everyone born from [birth year] through 1966.  Anyone with known risk factors for hepatitis C. Sexually transmitted infections (STIs)  Get screened for STIs, including gonorrhea and chlamydia, if: ?  You are sexually active and are younger than 25 years of age. ? You are older than 26 years of age and your health care provider tells you that you are at risk for this type of infection. ? Your sexual activity has changed since you were last screened, and you are at increased risk for chlamydia or gonorrhea. Ask your health care provider if you are at risk.  Ask your health care provider about whether you are at high risk for HIV. Your health care provider may recommend a prescription medicine to help prevent HIV infection. If you choose to take medicine to prevent HIV, you should first get tested for HIV. You should then be tested every 3 months for as long as you are taking  the medicine. Pregnancy  If you are about to stop having your period (premenopausal) and you may become pregnant, seek counseling before you get pregnant.  Take 400 to 800 micrograms (mcg) of folic acid every day if you become pregnant.  Ask for birth control (contraception) if you want to prevent pregnancy. Osteoporosis and menopause Osteoporosis is a disease in which the bones lose minerals and strength with aging. This can result in bone fractures. If you are 71 years old or older, or if you are at risk for osteoporosis and fractures, ask your health care provider if you should:  Be screened for bone loss.  Take a calcium or vitamin D supplement to lower your risk of fractures.  Be given hormone replacement therapy (HRT) to treat symptoms of menopause. Follow these instructions at home: Lifestyle  Do not use any products that contain nicotine or tobacco, such as cigarettes, e-cigarettes, and chewing tobacco. If you need help quitting, ask your health care provider.  Do not use street drugs.  Do not share needles.  Ask your health care provider for help if you need support or information about quitting drugs. Alcohol use  Do not drink alcohol if: ? Your health care provider tells you not to drink. ? You are pregnant, may be pregnant, or are planning to become pregnant.  If you drink alcohol: ? Limit how much you use to 0-1 drink a day. ? Limit intake if you are breastfeeding.  Be aware of how much alcohol is in your drink. In the [place] one drink equals one 12 oz bottle of beer (355 mL), one 5 oz glass of wine (148 mL), or one 1 oz glass of hard liquor (44 mL). General instructions  Schedule regular health, dental, and eye exams.  Stay current with your vaccines.  Tell your health care provider if: ? You often feel depressed. ? You have ever been abused or do not feel safe at home. Summary  Adopting a healthy lifestyle and getting preventive care are important in  promoting health and wellness.  Follow your health care provider's instructions about healthy diet, exercising, and getting tested or screened for diseases.  Follow your health care provider's instructions on monitoring your cholesterol and blood pressure. This information is not intended to replace advice given to you by your health care provider. Make sure you discuss any questions you have with your health care provider. Document Revised: 02/09/2018 Document Reviewed: 02/09/2018 Elsevier Patient Education  2021 [employer].

## 2020-05-30 NOTE — Progress Notes (Deleted)
GYNECOLOGY  VISIT  CC:   ***  HPI: 25 y.o. G0P0000 Single White or Caucasian female here for 6 week breast recheck.     GYNECOLOGIC HISTORY: No LMP recorded. (Menstrual status: IUD). Contraception: *** Menopausal hormone therapy: ***  Patient Active Problem List   Diagnosis Date Noted  . Cognitive deficit as late effect of traumatic brain injury (HCC) 09/18/2019  . Vitamin D deficiency 09/18/2019  . Body mass index (BMI) of 19.0-19.9 in adult 09/18/2019  . Family history of cerebral aneurysm 09/18/2019  . Anxiety, generalized 03/23/2018  . Insomnia due to anxiety and fear 03/23/2018  . Migraine without aura   . ADD (attention deficit disorder) 11/07/2013  . Bipolar depression (HCC) 11/07/2013  . Asthma   . IBS (irritable bowel syndrome)   . H/O cold sores     Past Medical History:  Diagnosis Date  . Anxiety   . Depression   . History of traumatic brain injury   . IBS (irritable bowel syndrome)    no current med.  . Migraines   . 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 S/P tonsillectomy 11/11/2016  . Seasonal allergies     Past Surgical History:  Procedure Laterality Date  . INTRAUTERINE DEVICE (IUD) INSERTION     02-27-15 paragard insertion  . TONSILLECTOMY N/A 11/11/2016   Procedure: TONSILLECTOMY;  Surgeon: Serena Colonel, MD;  Location: [facility];  Service: ENT;  Laterality: N/A;  . WISDOM TOOTH EXTRACTION      MEDS:   Current Outpatient Medications on File Prior to Visit  Medication Sig Dispense Refill  . acetaminophen (TYLENOL) 325 MG tablet Take 650 mg by mouth every 6 (six) hours as needed (pain).    Marland Kitchen ALPRAZolam (XANAX) 0.5 MG tablet TAKE 1/2 TO 1 TABLET 2 - 3 TIMES DAILY ONLY IF NEEDED FOR SEVERE ANXIETY/PANIC *MAX 5 DAYS PER WEEK* 30 tablet 0  . amphetamine-dextroamphetamine (ADDERALL) 20 MG tablet Take 1 tablet (20 mg total) by mouth See admin instructions. Take Monday through Friday. Take 1/2-1 tablet 1-2 times a day for ADD; max  5 days a week to avoid addiction. 60 tablet 0  . cetirizine (ZYRTEC) 10 MG tablet Take 10 mg by mouth as needed for allergies.    Marland Kitchen ibuprofen (ADVIL) 600 MG tablet Take 1 tablet (600 mg total) by mouth every 8 (eight) hours as needed. 60 tablet 1  . lamoTRIgine (LAMICTAL) 100 MG tablet Take 1 tablet 2 x /day for Mood 180 tablet 1   No current facility-administered medications on file prior to visit.    ALLERGIES: Peanut-containing drug products, Shellfish allergy, Penicillins, and Sulfa antibiotics  Family History  Problem Relation Age of Onset  . Allergies Mother   . Stroke Paternal Grandfather   . Aneurysm Paternal Grandfather   . Anuerysm Father      Review of Systems  PHYSICAL EXAMINATION:    There were no vitals taken for this visit.    General appearance: alert, cooperative, no acute distress  CV:  {Exam; heart brief:31539} Lungs:  {pe lungs ob:314451::"clear to auscultation, no wheezes, rales or rhonchi, symmetric air entry"} Breasts: {Exam; breast:13139::"normal appearance, no masses or tenderness"} Abdomen: soft, non-tender; no masses,  no organomegaly Lymph:  no inguinal LAD noted  Pelvic: External genitalia:  no lesions              Urethra:  normal appearing urethra with no masses, tenderness or lesions  Bartholins and Skenes: normal                 Vagina: normal appearing vagina               Cervix: {CHL AMB PHY EX CERVIX NORM DEFAULT:[phone]::"no lesions"}              Bimanual Exam:  Uterus:  {CHL AMB PHY EX UTERUS NORM DEFAULT:[phone]::"normal size, contour, position, consistency, mobility, non-tender"}              Adnexa: {CHL AMB PHY EX ADNEXA NO MASS DEFAULT:[phone]::"no mass, fullness, tenderness"}               Chaperone, ***, CMA, was present for exam.  Assessment: ***  Plan: ***   {NUMBERS; -10-45 JOINT ROM:10287} minutes of total time was spent for this patient encounter, including preparation, face-to-face counseling  with the patient and coordination of care, and documentation of the encounter.

## 2020-06-03 ENCOUNTER — Ambulatory Visit: Payer: Managed Care, Other (non HMO) | Admitting: Nurse Practitioner

## 2020-07-01 ENCOUNTER — Encounter: Payer: Self-pay | Admitting: Nurse Practitioner

## 2020-07-01 ENCOUNTER — Other Ambulatory Visit: Payer: Self-pay

## 2020-07-01 DIAGNOSIS — F988 Other specified behavioral and emotional disorders with onset usually occurring in childhood and adolescence: Secondary | ICD-10-CM

## 2020-07-02 MED ORDER — AMPHETAMINE-DEXTROAMPHETAMINE 20 MG PO TABS: 20.0000 mg | ORAL_TABLET | ORAL | 0 refills | Status: DC

## 2020-07-02 MED ORDER — ALPRAZOLAM 0.5 MG PO TABS: ORAL_TABLET | ORAL | 0 refills | Status: DC

## 2020-07-15 ENCOUNTER — Encounter (INDEPENDENT_AMBULATORY_CARE_PROVIDER_SITE_OTHER): Payer: 59

## 2020-07-15 DIAGNOSIS — N390 Urinary tract infection, site not specified: Secondary | ICD-10-CM | POA: Diagnosis not present

## 2020-07-15 DIAGNOSIS — R300 Dysuria: Secondary | ICD-10-CM

## 2020-07-15 MED ORDER — NITROFURANTOIN MONOHYD MACRO 100 MG PO CAPS
100.0000 mg | ORAL_CAPSULE | Freq: Two times a day (BID) | ORAL | 0 refills | Status: DC
Start: 2020-07-15 — End: 2020-09-19

## 2020-07-15 NOTE — Telephone Encounter (Signed)
THIS ENCOUNTER IS A VIRTUAL VISIT DUE TO COVID-19 - PATIENT WAS NOT SEEN IN THE OFFICE.  PATIENT HAS CONSENTED TO VIRTUAL VISIT / TELEMEDICINE VISIT   Virtual Visit via telephone Note  I connected with Hannah Reeves on 07/15/2020 by telephone.  I verified that I am speaking with the correct person using two identifiers.    I discussed the limitations of evaluation and management by telemedicine and the availability of in person appointments. The patient expressed understanding and agreed to proceed.  History of Present Illness:  25 y.o. with hx of UTI reports acute onset of typical UTI sx, dysuria, frequency/constant urge, urgency, pelvic pressure, notes cloudy and mildy odorous urine. Denies hematuria, fever/chills, flank pain.   Denies vaginal discharge, no new sexual partners, denies STI hx  Typical for hx of UTI.   Currently in [place] for work    Medications    Current Outpatient Medications (Respiratory):  .  cetirizine (ZYRTEC) 10 MG tablet, Take 10 mg by mouth as needed for allergies.  Current Outpatient Medications (Analgesics):  .  acetaminophen (TYLENOL) 325 MG tablet, Take 650 mg by mouth every 6 (six) hours as needed (pain). Marland Kitchen  ibuprofen (ADVIL) 600 MG tablet, Take 1 tablet (600 mg total) by mouth every 8 (eight) hours as needed.   Current Outpatient Medications (Other):  Marland Kitchen  ALPRAZolam (XANAX) 0.5 MG tablet, TAKE 1/2 TO 1 TABLET 2 - 3 TIMES DAILY ONLY IF NEEDED FOR SEVERE ANXIETY/PANIC *MAX 5 DAYS PER WEEK* .  amphetamine-dextroamphetamine (ADDERALL) 20 MG tablet, Take 1 tablet (20 mg total) by mouth See admin instructions. Take Monday through Friday. Take 1/2-1 tablet 1-2 times a day for ADD; max 5 days a week to avoid addiction. Marland Kitchen  lamoTRIgine (LAMICTAL) 100 MG tablet, Take 1 tablet 2 x /day for Mood  Problem list She has Asthma; IBS (irritable bowel syndrome); H/O cold sores; ADD (attention deficit disorder); Bipolar depression (HCC); Migraine without aura;  Anxiety, generalized; Insomnia due to anxiety and fear; Cognitive deficit as late effect of traumatic brain injury (HCC); Vitamin D deficiency; Body mass index (BMI) of 19.0-19.9 in adult; and Family history of cerebral aneurysm on their problem list.   Allergies  Allergen Reactions  . Peanut-Containing Drug Products Shortness Of Breath and Itching    TREE NUTS - ITCHING OF MOUTH   . Shellfish Allergy Shortness Of Breath and Itching    ITCHING OF MOUTH  . Penicillins Hives  . Sulfa Antibiotics Hives    Observations/Objective: General Appearance: sounds in no apparent distress.  ENT/Mouth: No hoarseness, No cough for duration of visit.  Respiratory: Respiratory effort normal, speaks in complete sentences Neuro: Awake and oriented X 3,  Psych:  normal affect, Insight and Judgment appropriate.   Assessment and Plan:  Diagnoses and all orders for this visit:  Urinary tract infection without hematuria, site unspecified Medications: nitrofurantoin. Maintain adequate hydration. Follow up if symptoms not improving, and as needed. -     nitrofurantoin, macrocrystal-monohydrate, (MACROBID) 100 MG capsule; Take 1 capsule (100 mg total) by mouth 2 (two) times daily.   Follow Up Instructions:  I discussed the assessment and treatment plan with the patient. The patient was provided an opportunity to ask questions and all were answered. The patient agreed with the plan and demonstrated an understanding of the instructions.   The patient was advised to call back or seek an in-person evaluation if the symptoms worsen or if the condition fails to improve as anticipated.  I provided 15  minutes of non-face-to-face time during this encounter.   Dan Maker, NP

## 2020-09-18 NOTE — Progress Notes (Addendum)
Assessment and Plan:  There are no diagnoses linked to this encounter.  Hannah Reeves was seen today for blurred vision and dizziness.  Diagnoses and all orders for this visit:  Dizziness -     CBC with Differential/Platelet -     COMPLETE METABOLIC PANEL WITH GFR -     TSH  Dislocation of temporomandibular joint, initial encounter   Advised to wear night guard for sleeping  Instructed to use relaxation and stretching exercises  Insomnia due to anxiety and fear -     traZODone (DESYREL) 50 MG tablet; 1/2-1 tablet for sleep  Fibrocystic disease of left breast  Pt advised to limit caffeine intake and monitor , if any changes call the office  Vitamin D deficiency -     VITAMIN D 25 Hydroxy (Vit-D Deficiency, Fractures)  POTS (postural orthostatic tachycardia syndrome) -     CBC with Differential/Platelet -     COMPLETE METABOLIC PANEL WITH GFR -     Tsh - Encourage increased fluid intake and moderate salt intake,  avoid changing position quickly.   Further disposition pending results of labs. Discussed med's effects and SE's.   Over 30 minutes of exam, counseling, chart review, and critical decision making was performed.   Future Appointments  Date Time Provider Department Center  09/26/2020  2:00 PM Judd Gaudier, NP [facility] None    ------------------------------------------------------------------------------------------------------------------   HPI BP (!) 90/58   Pulse (!) 107   Temp 97.7 F (36.5 C)   Wt 110 lb 6.4 oz (50.1 kg)   SpO2 97%   BMI 19.40 kg/m   25 y.o.female presents for blurred vision and vertigo x 1 month, feels nauseated with symptoms of dizziness which comes and goes.  She is having proprioception/balance difficulties in the past month.  Notices heart racing when changing position.    Has a history of migraines, has had a low grade headache for a month with minimal relief with use of tylenol.  Had used Topamax in the past which did help but was  tired of using meds.     Pt states she is not sleeping well.  Used Trazodone in the past with good success.  Increased stress in the past 3 months, changed jobs and looking for a new place to live.    Pt has notices change in left breast which she has had OB/GYN evaluate.  They stated it was benign.  She does drink a lot of caffeine.    Pt also states her father and her grandfather both had brain aneurysms and his doctor was recommending MRA on his children at age 43 Past Medical History:  Diagnosis Date   Anxiety    Depression    History of traumatic brain injury    IBS (irritable bowel syndrome)    no current med.   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   ALPRAZolam (XANAX) 0.5 MG tablet TAKE 1/2 TO 1 TABLET 2 -  3 TIMES DAILY ONLY IF NEEDED FOR SEVERE ANXIETY/PANIC *MAX 5 DAYS PER WEEK*   amphetamine-dextroamphetamine (ADDERALL) 20 MG tablet Take 1 tablet (20 mg total) by mouth See admin instructions. Take Monday through Friday. Take 1/2-1 tablet 1-2 times a day for ADD; max 5 days a week to avoid addiction.   cetirizine (ZYRTEC) 10 MG tablet Take 10 mg by mouth as needed for allergies.   ibuprofen (ADVIL) 600 MG tablet Take 1 tablet (600 mg total) by mouth every 8 (eight) hours as needed. (Patient taking differently: Take 600 mg by mouth every 8 (eight) hours as needed. prn)   lamoTRIgine (LAMICTAL) 100 MG tablet Take 1 tablet 2 x /day for Mood   No current facility-administered  medications on file prior to visit.    Review of Systems  Constitutional:  Positive for malaise/fatigue. Negative for chills and fever.  HENT:  Positive for congestion and sinus pain. Negative for hearing loss and sore throat.        Jaw pain  Eyes:  Positive for blurred vision (when having dizziness with position change). Negative for double vision and photophobia.  Respiratory:  Negative for cough, hemoptysis, sputum production, shortness of breath and wheezing.   Cardiovascular:  Positive for palpitations (heart races with position change). Negative for chest pain, orthopnea and leg swelling.  Gastrointestinal:  Positive for nausea (with dizziness). Negative for abdominal pain.  Genitourinary:  Negative for dysuria and urgency.  Musculoskeletal:  Negative for back pain and myalgias.  Skin:  Negative for rash.  Neurological:  Positive for dizziness and headaches.  Psychiatric/Behavioral:  The patient is nervous/anxious and has insomnia.      Physical Exam:  BP (!) 90/58   Pulse (!) 107   Temp 97.7 F (36.5 C)   Wt 110 lb 6.4 oz (50.1 kg)   SpO2 97%   BMI 19.40 kg/m   General Appearance: Well nourished, in no apparent distress. Eyes: PERRLA, EOMs, conjunctiva no swelling or erythema Sinuses: No Frontal/maxillary tenderness ENT/Mouth: Ext aud canals clear, TMs without erythema, bulging. No erythema, swelling, or exudate on post pharynx.  Tonsils not swollen or erythematous. Hearing normal. No clicking of TM joint Neck: Supple, thyroid normal.  Respiratory: Respiratory effort normal, BS equal bilaterally without rales, rhonchi, wheezing or stridor.  Cardio: RRR with no MRGs. Brisk peripheral pulses without edema.  Abdomen: Soft, + BS.  Non tender, no guarding, rebound, hernias, masses. Lymphatics: Non tender without lymphadenopathy.  Musculoskeletal: Full ROM, 5/5 strength, normal gait.  Skin: Warm, dry without rashes, lesions, ecchymosis.  Neuro: Cranial nerves intact. Normal  muscle tone, no cerebellar symptoms. Sensation intact.  Psych: Awake and oriented X 3, normal affect, Insight and Judgment appropriate.     Hannah Humphrey, NP 11:30 AM [facility]

## 2020-09-19 ENCOUNTER — Other Ambulatory Visit: Payer: Self-pay

## 2020-09-19 ENCOUNTER — Encounter: Payer: Self-pay | Admitting: Nurse Practitioner

## 2020-09-19 ENCOUNTER — Ambulatory Visit (INDEPENDENT_AMBULATORY_CARE_PROVIDER_SITE_OTHER): Payer: 59 | Admitting: Nurse Practitioner

## 2020-09-19 VITALS — BP 90/58 | HR 107 | Temp 97.7°F | Wt 110.4 lb

## 2020-09-19 DIAGNOSIS — F5105 Insomnia due to other mental disorder: Secondary | ICD-10-CM

## 2020-09-19 DIAGNOSIS — N6012 Diffuse cystic mastopathy of left breast: Secondary | ICD-10-CM

## 2020-09-19 DIAGNOSIS — F409 Phobic anxiety disorder, unspecified: Secondary | ICD-10-CM

## 2020-09-19 DIAGNOSIS — S0300XA Dislocation of jaw, unspecified side, initial encounter: Secondary | ICD-10-CM

## 2020-09-19 DIAGNOSIS — E559 Vitamin D deficiency, unspecified: Secondary | ICD-10-CM

## 2020-09-19 DIAGNOSIS — R42 Dizziness and giddiness: Secondary | ICD-10-CM

## 2020-09-19 DIAGNOSIS — I671 Cerebral aneurysm, nonruptured: Secondary | ICD-10-CM

## 2020-09-19 DIAGNOSIS — I498 Other specified cardiac arrhythmias: Secondary | ICD-10-CM

## 2020-09-19 DIAGNOSIS — G90A Postural orthostatic tachycardia syndrome (POTS): Secondary | ICD-10-CM

## 2020-09-19 MED ORDER — TRAZODONE HCL 50 MG PO TABS: ORAL_TABLET | ORAL | 3 refills | Status: DC

## 2020-09-19 NOTE — Addendum Note (Signed)
Addended by: Revonda Humphrey on: 09/19/2020 05:19 PM   Modules accepted: Orders

## 2020-09-19 NOTE — Patient Instructions (Addendum)
Use trazodone 1 hour before bedtime Use night guard for TMJ symptoms Limit Caffeine Stay well hydrated and have moderate salt intake  Postural Orthostatic Tachycardia Syndrome Postural orthostatic tachycardia syndrome (POTS) is a group of symptoms that occur when a person stands up after lying down. POTS occurs when less blood than normal flows to the body when you stand up. The reduced blood flow to thebody makes the heart beat rapidly. POTS may be associated with another medical condition, or it may occur on itsown. What are the causes? The cause of this condition is not known, but many conditions and diseases areassociated with it. What increases the risk? This condition is more likely to develop in: Women 3-70 years old. Women who are pregnant. Women who are in their period (menstruating). People who have certain conditions, such as: Infection from a virus. Attacks of healthy organs by the body's immunity (autoimmune disease). Losing a lot of red blood cells (anemia). Losing too much water in the body (dehydration). An overactive thyroid (hyperthyroidism). People who take certain medicines. People who have had a major injury. People who have had surgery. What are the signs or symptoms? The most common symptom of this condition is light-headedness when one stands from a lying or sitting position. Other symptoms may include: Feeling a rapid increase in the heartbeat (tachycardia) within 10 minutes of standing up. Fainting. Weakness. Confusion. Trembling. Shortness of breath. Sweating or flushing. Headache. Chest pain. Breathing that is deeper and faster than normal (hyperventilation). Nausea. Anxiety. Symptoms may be worse in the morning, and they may be relieved by lying down. How is this diagnosed? This condition is diagnosed based on: Your symptoms. Your medical history. A physical exam. Checking your heart rate when you are lying down and after you stand  up. Checking your blood pressure when you go from lying down to standing up. Blood tests to measure hormones that change with blood pressure. The blood tests will be done when you are lying down and when you are standing up. You may have other tests to check for conditions or diseases that areassociated with POTS. How is this treated? Treatment for this condition depends on how severe your symptoms are and whether you have any conditions or diseases that are associated with POTS. Treatment may involve: Treating any conditions or diseases that are associated with POTS. Drinking two glasses of water before getting up from a lying position. Eating more salt (sodium). Taking medicine to control blood pressure and heart rate (beta-blocker). Avoiding certain medicines. Starting an exercise program under the supervision of a health care provider. Follow these instructions at home: Medicines Take over-the-counter and prescription medicines only as told by your health care provider. Let your health care provider know about all prescription or over-the-counter medicines. These include herbs, vitamins, and supplements. You may need to stop or adjust some medicines if they cause this condition. Talk with your health care provider before starting any new medicines. Eating and drinking  Drink enough fluid to keep your urine pale yellow. If told by your health care provider, drink two glasses of water before getting up from a lying position. Follow instructions from your health care provider about how much sodium you should eat. Avoid heavy meals. Eat several small meals a day instead of a few large meals.  General instructions Do an aerobic exercise for 20 minutes a day, at least 3 days a week. Ask your health care provider what kinds of exercise are safe for you. Do not use  any products that contain nicotine or tobacco, such as cigarettes and e-cigarettes. These can interfere with blood flow. If you  need help quitting, ask your health care provider. Keep all follow-up visits as told by your health care provider. This is important. Contact a health care provider if: Your symptoms do not improve after treatment. Your symptoms get worse. You develop new symptoms. Get help right away if: You have chest pain. You have difficulty breathing. You have fainting episodes. These symptoms may represent a serious problem that is an emergency. Do not wait to see if the symptoms will go away. Get medical help right away. Call your local emergency services (911 in the [place] Do not drive yourself to the hospital. Summary POTS is a condition that can cause light-headedness, fainting, and palpitations when you go from a sitting or lying position to a standing position. It occurs when less blood than normal flows to the body when you stand up. Treatment for this condition includes treating any underlying conditions, drinking plenty of water, stopping or changing some medicines, or starting an exercise program. Get help right away if you have chest pain, difficulty breathing, or fainting episodes. These may represent a serious problem that is an emergency. This information is not intended to replace advice given to you by your health care provider. Make sure you discuss any questions you have with your healthcare provider. Document Revised: 03/30/2017 Document Reviewed: 03/30/2017 Elsevier Patient Education  2022 [employer].

## 2020-09-20 ENCOUNTER — Other Ambulatory Visit: Payer: Self-pay

## 2020-09-20 DIAGNOSIS — F988 Other specified behavioral and emotional disorders with onset usually occurring in childhood and adolescence: Secondary | ICD-10-CM

## 2020-09-20 LAB — CBC WITH DIFFERENTIAL/PLATELET
Absolute Monocytes: 622 cells/uL (ref 200–950)
Basophils Absolute: 22 cells/uL (ref 0–200)
Basophils Relative: 0.3 %
Eosinophils Absolute: 67 cells/uL (ref 15–500)
Eosinophils Relative: 0.9 %
HCT: 39.9 % (ref 35.0–45.0)
Hemoglobin: 13.3 g/dL (ref 11.7–15.5)
Lymphs Abs: 2708 cells/uL (ref 850–3900)
MCH: 29.8 pg (ref 27.0–33.0)
MCHC: 33.3 g/dL (ref 32.0–36.0)
MCV: 89.5 fL (ref 80.0–100.0)
MPV: 9.7 fL (ref 7.5–12.5)
Monocytes Relative: 8.4 %
Neutro Abs: 3981 cells/uL (ref 1500–7800)
Neutrophils Relative %: 53.8 %
Platelets: 372 10*3/uL (ref 140–400)
RBC: 4.46 10*6/uL (ref 3.80–5.10)
RDW: 12 % (ref 11.0–15.0)
Total Lymphocyte: 36.6 %
WBC: 7.4 10*3/uL (ref 3.8–10.8)

## 2020-09-20 LAB — COMPLETE METABOLIC PANEL WITH GFR
AG Ratio: 2.1 (calc) (ref 1.0–2.5)
ALT: 10 U/L (ref 6–29)
AST: 18 U/L (ref 10–30)
Albumin: 5 g/dL (ref 3.6–5.1)
Alkaline phosphatase (APISO): 55 U/L (ref 31–125)
BUN: 9 mg/dL (ref 7–25)
CO2: 29 mmol/L (ref 20–32)
Calcium: 10.1 mg/dL (ref 8.6–10.2)
Chloride: 103 mmol/L (ref 98–110)
Creat: 0.85 mg/dL (ref 0.50–0.96)
Globulin: 2.4 g/dL (calc) (ref 1.9–3.7)
Glucose, Bld: 75 mg/dL (ref 65–99)
Potassium: 4.2 mmol/L (ref 3.5–5.3)
Sodium: 139 mmol/L (ref 135–146)
Total Bilirubin: 0.5 mg/dL (ref 0.2–1.2)
Total Protein: 7.4 g/dL (ref 6.1–8.1)
eGFR: 97 mL/min/{1.73_m2} (ref >=60)

## 2020-09-20 LAB — TSH: TSH: 1.74 mIU/L

## 2020-09-20 LAB — VITAMIN D 25 HYDROXY (VIT D DEFICIENCY, FRACTURES): Vit D, 25-Hydroxy: 39 ng/mL (ref 30–100)

## 2020-09-20 MED ORDER — ALPRAZOLAM 0.5 MG PO TABS: ORAL_TABLET | ORAL | 0 refills | Status: DC

## 2020-09-20 MED ORDER — AMPHETAMINE-DEXTROAMPHETAMINE 20 MG PO TABS: 20.0000 mg | ORAL_TABLET | ORAL | 0 refills | Status: DC

## 2020-09-26 ENCOUNTER — Encounter: Payer: Managed Care, Other (non HMO) | Admitting: Adult Health

## 2020-10-03 ENCOUNTER — Ambulatory Visit
Admission: RE | Admit: 2020-10-03 | Discharge: 2020-10-03 | Disposition: A | Discharge status: Home / Self Care | Payer: 59 | Source: Ambulatory Visit | Attending: Nurse Practitioner | Admitting: Nurse Practitioner

## 2020-10-03 ENCOUNTER — Other Ambulatory Visit: Payer: Self-pay

## 2020-10-03 DIAGNOSIS — I671 Cerebral aneurysm, nonruptured: Secondary | ICD-10-CM

## 2020-10-07 ENCOUNTER — Other Ambulatory Visit: Payer: Self-pay | Admitting: Nurse Practitioner

## 2020-10-15 ENCOUNTER — Other Ambulatory Visit (HOSPITAL_COMMUNITY): Payer: Self-pay | Admitting: Neuroradiology

## 2020-10-15 DIAGNOSIS — I729 Aneurysm of unspecified site: Secondary | ICD-10-CM

## 2020-10-22 ENCOUNTER — Other Ambulatory Visit: Payer: Self-pay

## 2020-10-22 ENCOUNTER — Ambulatory Visit (HOSPITAL_COMMUNITY)
Admission: RE | Admit: 2020-10-22 | Discharge: 2020-10-22 | Disposition: A | Discharge status: Home / Self Care | Payer: 59 | Source: Ambulatory Visit | Attending: Neuroradiology | Admitting: Neuroradiology

## 2020-10-22 DIAGNOSIS — I729 Aneurysm of unspecified site: Secondary | ICD-10-CM

## 2020-10-23 NOTE — H&P (Signed)
Chief Complaint: Patient was seen today for left internal carotid artery aneurysm.  Supervising Physician: Baldemar Lenis  Patient Status: [facility] - Out-pt  History of Present Illness: Hannah Reeves is a 25 y.o. 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  Her family history is significant for grandfather and father with ruptured brain aneurysms.  I personally reviewed her brain MRA performed on October 03, 2020.  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  It appears stable to minimally increased when compared to MR angiogram performed on March 01, 2014.  Additionally, an outpouching from the proximal right A2/ACA is seen (MR angiogram series 5, image 96) measuring approximately 2.5 mm in length on sagittal reconstructions.  This may represent an additional saccular aneurysm versus an infundibulum and appear stable when compared to prior MR angiogram.    Past Medical History:  Diagnosis Date   Anxiety    Depression    History of traumatic brain injury    IBS (irritable bowel syndrome)    no current med.   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 Authorizing Provider  acetaminophen (TYLENOL)  325 MG tablet Take 650 mg by mouth every 6 (six) hours as needed (pain).    [provider]  ALPRAZolam (XANAX) 0.5 MG tablet TAKE 1/2 TO 1 TABLET 2 - 3 TIMES DAILY ONLY IF NEEDED FOR SEVERE ANXIETY/PANIC *MAX 5 DAYS PER WEEK* 09/20/20   Revonda Humphrey, NP  amphetamine-dextroamphetamine (ADDERALL) 20 MG tablet Take 1 tablet (20 mg total) by mouth See admin instructions. Take Monday through Friday. Take 1/2-1 tablet 1-2 times a day for ADD; max 5 days a week to avoid addiction. 09/20/20   Revonda Humphrey, NP  cetirizine (ZYRTEC) 10 MG tablet Take 10 mg by mouth as needed for allergies.    [provider]  ibuprofen (ADVIL) 600 MG tablet Take 1 tablet (600 mg total) by mouth every 8 (eight) hours as needed. Patient taking differently: Take 600 mg by mouth every 8 (eight) hours as needed. prn 04/23/20   Clarita Crane, NP  lamoTRIgine (LAMICTAL) 100 MG tablet Take 1 tablet 2 x /day for Mood 03/25/20   Judd Gaudier, NP  traZODone (DESYREL) 50 MG tablet 1/2-1 tablet for sleep 09/19/20   Revonda Humphrey, NP     Family History  Problem Relation Age of Onset   Allergies Mother    Stroke Paternal Grandfather    Aneurysm Paternal Grandfather    Anuerysm Father     Social History   Socioeconomic History   Marital status: Single    Spouse name: Not on file   Number of children: Not on file   Years of education: Not on file   Highest education level: Not on file  Occupational History  Not on file  Tobacco Use   Smoking status: Never   Smokeless tobacco: Never  Vaping Use   Vaping Use: Never used  Substance and Sexual Activity   Alcohol use: Yes    Alcohol/week: 2.0 standard drinks    Types: 2 Glasses of wine per week   Drug use: Not Currently   Sexual activity: Yes    Partners: Male    Birth control/protection: I.U.D.  Other Topics Concern   Not on file  Social History Narrative   Not on file   Social Determinants of Health   Financial Resource Strain: Not on file  Food  Insecurity: Not on file  Transportation Needs: Not on file  Physical Activity: Not on file  Stress: Not on file  Social Connections: Not on file     Review of Systems: A 12 point ROS discussed and pertinent positives are indicated in the HPI above.  All other systems are negative.  Review of Systems  Vital Signs: There were no vitals taken for this visit.  Physical Exam Constitutional:      Appearance: Normal appearance.  Neurological:     Mental Status: She is alert and oriented to person, place, and time.     Cranial Nerves: No cranial nerve deficit.     Sensory: No sensory deficit.     Motor: No weakness.  Psychiatric:        Mood and Affect: Mood is anxious.        Behavior: Behavior normal.        Thought Content: Thought content normal.         Imaging: MR Angiogram Head Wo Contrast  Result Date: 10/04/2020 CLINICAL DATA:  Family history of brain aneurysm. EXAM: MRA HEAD WITHOUT CONTRAST TECHNIQUE: Angiographic images of the Circle of Willis were acquired using MRA technique without intravenous contrast. COMPARISON:  MRA head 03/01/2014 FINDINGS: Anterior circulation: Internal carotid artery widely patent bilaterally. Anterior and middle cerebral arteries widely patent without stenosis. 2 x 3 mm aneurysm proximal left cavernous carotid. Aneurysm is proximal to the anterior genu of the cavernous carotid and projects inferiorly and posteriorly. Prior study is degraded by artifact however the aneurysm may be slightly larger on source images when comparing the 2 studies. Posterior circulation: Both vertebral arteries patent to the basilar. PICA, AICA patent. Basilar widely patent. Superior cerebellar and posterior cerebral arteries widely patent. Posterior communicating artery is patent bilaterally. No cerebral aneurysm in the posterior circulation Anatomic variants: None Other: None. IMPRESSION: 2 x 3 mm aneurysm of the proximal left cavernous carotid. This may be slightly  larger compared with the MRA 2015. Electronically Signed   By: Marlan Palau M.D.   On: 10/04/2020 15:23    Labs:  CBC: Recent Labs    02/22/20 1623 09/19/20 1144  WBC 5.9 7.4  HGB 13.7 13.3  HCT 40.0 39.9  PLT 391 372    COAGS: No results for input(s): INR, APTT in the last 8760 hours.  BMP: Recent Labs    02/22/20 1623 09/19/20 1144  NA 138 139  K 3.4* 4.2  CL 100 103  CO2 26 29  GLUCOSE 94 75  BUN 6 9  CALCIUM 9.9 10.1  CREATININE 0.72 0.85  GFRNONAA >60  --     LIVER FUNCTION TESTS: Recent Labs    09/19/20 1144  BILITOT 0.5  AST 18  ALT 10  PROT 7.4    TUMOR MARKERS: No results for input(s): AFPTM, CEA, CA199, CHROMGRNA in the last  8760 hours.  Assessment and Plan:  I spoke to miss Mian and her mother.  She is quite anxious given family history of ruptured brain aneurysm.  I informed them that the cavernous left ICA aneurysm is small and does not require treatment at the moment.  I explained that a diagnostic cerebral angiogram could help clarify whether the outpouching from the right A2/ACA corresponds to an aneurysm or an infundibulum and informed at it is reassuring that it remains stable for 7 years.  Ms. Ackerley would like to avoid the angiogram and prefers to follow-up with an MR angiogram in 1 year.  She will call our office in case she changes her mind.   Thank you for this interesting consult.  I greatly enjoyed meeting REBEKKAH POWLESS and look forward to participating in their care.  A copy of this report was sent to the requesting provider on this date.  Electronically Signed: Baldemar Lenis, MD 10/23/2020, 8:23 AM   I spent a total of  40 Minutes   in face to face in clinical consultation, greater than 50% of which was counseling/coordinating care for brain aneurysm.

## 2020-10-24 ENCOUNTER — Telehealth: Payer: Self-pay | Admitting: Adult Health

## 2020-10-24 NOTE — Telephone Encounter (Signed)
Scheduled and seen. Patient called to advise she scheduled w/ hx provider  Dr. Baldemar Lenis, to follow her regarding family hx of brain aneurysm.

## 2021-01-01 ENCOUNTER — Other Ambulatory Visit: Payer: Self-pay

## 2021-01-01 MED ORDER — ALPRAZOLAM 0.5 MG PO TABS: ORAL_TABLET | ORAL | 0 refills | Status: DC

## 2021-01-03 DIAGNOSIS — I671 Cerebral aneurysm, nonruptured: Secondary | ICD-10-CM | POA: Insufficient documentation

## 2021-01-03 NOTE — Progress Notes (Signed)
Complete Physical  Assessment and Plan:  Hannah Reeves was seen today for annual exam.  Diagnoses and all orders for this visit:  Encounter for routine adult health examination without abnormal findings Health Maintenance- Discussed STD testing, safe sex, alcohol and drug awareness, drinking and driving dangers, wearing a seat belt and general safety measures for young adult.  Left cavernous carotid aneurysm Following with Dr. Dia Crawford Rodriques   TMJ/ tension headaches Frequent headaches Increased recent headaches - tension, TMJ Saw specialist who recommended TMJ MRI, will order out of our office for cost advantage Heat to neck, massage, gentle stretching Avoid hard foods Stress management techniques discussed, increase water, good sleep hygiene discussed, increase exercise, and increase veggies.   Mild intermittent asthma without complication No recent flares; monitor; avoid triggers  Irritable bowel syndrome, unspecified type Working on lifestyle; if not on soluble fiber add if having flares.   Insomnia due to anxiety and fear Managing with benadryl/xanax PRN Stress reduction and good sleep hygiene discussed, increase day time activity, try melatonin or benadryl if this does not help we will call in sleep medication.   H/O cold sores Continue PRN antiviral   Bipolar depression (HCC) Lamictal is helping, will add lexapro 5 mg Encouraged follow up with counselor; sig work related stress ? R/t hx of TBI; monitor closely  Follow up in 8-12 weeks or sooner if needed  Anxiety, generalized Recommend SSRI; discussed and initiated low dose lexapro to add to lamictal; close follow up Stress management techniques discussed, plans to get new job Encourage counseling increase water, good sleep hygiene discussed, increase exercise, and increase veggies.  -     TSH  Attention deficit disorder (ADD) without hyperactivity Continue medications Helps with focus, no AE's. The  patient was counseled on the addictive nature of the medication and was encouraged to take drug holidays when not needed.   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  Check lipid panel.  -     TSH -     Lipid panel  Screening for thyroid disorder -     TSH  Medication management -     CBC with Differential/Platelet -     COMPLETE METABOLIC PANEL WITH GFR -     Magnesium -     Urinalysis, Routine w reflex microscopic  B12 deficiency -     Vitamin B12  Vitamin D deficiency -     VITAMIN D 25 Hydroxy (Vit-D Deficiency, Fractures)  Hypotension/dizziness Increase fluid intake, eat regularly, add salt if needed Stress management reviewed Follow up if any syncope or not improving    Discussed med's effects and SE's. Screening labs and tests as requested with regular follow-up as recommended. Over 40 minutes of exam, counseling, chart review, and complex, high level critical decision making was performed this visit.   Future Appointments  Date Time Provider Department Center  03/20/2021  9:30 AM Judd Gaudier, NP [facility] None  07/16/2021  9:30 AM Judd Gaudier, NP [facility] None  01/13/2022  9:00 AM Judd Gaudier, NP [facility] None      HPI  25 y.o. female  presents for a complete physical and follow up for has Asthma; IBS (irritable bowel syndrome); H/O cold sores; ADD (attention deficit disorder); Bipolar depression (HCC); Migraine without aura; Anxiety, generalized; Insomnia due to anxiety and fear; Cognitive deficit as late effect of traumatic brain injury (HCC); Vitamin  D deficiency; Body mass index (BMI) of 19.0-19.9 in adult; Family history of cerebral aneurysm; and Left cavernous carotid aneurysm on their problem list. She has hx of TBI falling off of a porch deck in 2016, reports residual cognitive difficulties that  never fully resolved, though does maintain good employment.   She has boyfriend of 3 years, good relationship, has IUD and uses condoms, declines STD testing today. She follows with GYN Dr. Conley Simmonds at [facility] care, goes annually.   She does report work in stressful, started new job this year and hates it. Considering returning to previous position, was offered much better pay.   She has been having more HA, tension/TMJ flare for several months, bilateral, "band around head", pain with opening jaw, chewing. Just saw specialist Dr. Collene Leyden DDS today who recommended we order TMJ MRI to forward, he is out of network for her. Not eating hard foods, painful to open mouth much.   Father and PGF had aneurysms; patient had MRA in 09/2020 that showed small 2 x 3 mm aneurysm of the proximal left cavernous carotid. She is now following with Dr. Dia Crawford Rodrique.  She was initiated in 2020 on lamotrigine due to labile mood and depressive episodes with suspicion for type 2 bipolar disorder. Taking 200 mg daily with perceived benefit, improved mood, less lability, fewer outbursts. Stopped trazodone/buspar, was declining SSRI, Takes PRN xanax for anxiety (30 tabs last 2 months) but admits using more with work stress. Was working with therapist but wasn't good fit, has been too busy to find a new one.   She is on adderall for ADD for many years; takes as needed for work.  She is aware this may make anxiety worse, but feels strongly she needs for mood and focus. Hasn't noted worse anxiety on days she takes.   BMI is Body mass index is 19.24 kg/m.,  She is staying at home with parents and brother.  She is trying to work on lifestyle, getting out to exercise and walk as much as possible but typically tired after work, cooking healthy meals at home for family, but often forgets to eat lunch. Has cut down on alcohol, previous 4 glasses of wine, down to 1-2 per week. Denies drug use.   Wt Readings from Last 3 Encounters:  01/07/21 111 lb 3.2 oz (50.4 kg)  09/19/20 110 lb 6.4 oz (50.1 kg)  04/23/20 113 lb (51.3 kg)   Today their BP is BP: (!) 82/54 She does workout. She denies chest pain, shortness of breath. She does have episodes of dizziness. Admits intermittently poor fluid intake, hasn't had much today.   She is not on cholesterol medication and denies myalgias. Her cholesterol is not at goal. The cholesterol last visit was:   Lab Results  Component Value Date   CHOL 178 09/18/2019   HDL 67 09/18/2019   LDLCALC 97 09/18/2019   TRIG 58 09/18/2019   CHOLHDL 2.7 09/18/2019   She has been working on diet and exercise for glucose management. Last A1C in the office was:  Lab Results  Component Value Date   HGBA1C 5.2 06/28/2013   Last GFR: Lab Results  Component Value Date   GFRNONAA >60 02/22/2020   Patient is currently on Vitamin D supplement, takes unkonwn dose irregularly.  Lab Results  Component Value Date   VD25OH 39 09/19/2020     She is not taking supplement  Lab Results  Component Value Date   VITAMINB12 316  09/18/2019      Current Medications:  Current Outpatient Medications on File Prior to Visit  Medication Sig Dispense Refill   acetaminophen (TYLENOL) 325 MG tablet Take 650 mg by mouth every 6 (six) hours as needed (pain).     ALPRAZolam (XANAX) 0.5 MG tablet TAKE 1/2 TO 1 TABLET 2 - 3 TIMES DAILY ONLY IF NEEDED FOR SEVERE ANXIETY/PANIC *MAX 5 DAYS PER WEEK* 30 tablet 0   amphetamine-dextroamphetamine (ADDERALL) 20 MG tablet Take 1 tablet (20 mg total) by mouth See admin instructions. Take Monday through Friday. Take 1/2-1 tablet 1-2 times a day for ADD; max 5 days a week to avoid addiction. 60 tablet 0   cetirizine (ZYRTEC) 10 MG tablet Take 10 mg by mouth as needed for allergies.     lamoTRIgine (LAMICTAL) 100 MG tablet Take 1 tablet 2 x /day for Mood 180 tablet 1   ibuprofen (ADVIL) 600 MG tablet Take 1 tablet (600 mg total) by mouth  every 8 (eight) hours as needed. (Patient not taking: Reported on 01/07/2021) 60 tablet 1   No current facility-administered medications on file prior to visit.   Allergies:  Allergies  Allergen Reactions   Peanut-Containing Drug Products Shortness Of Breath and Itching    TREE NUTS - ITCHING OF MOUTH    Shellfish Allergy Shortness Of Breath and Itching    ITCHING OF MOUTH   Penicillins Hives   Sulfa Antibiotics Hives   Medical History:  She has Asthma; IBS (irritable bowel syndrome); H/O cold sores; ADD (attention deficit disorder); Bipolar depression (HCC); Migraine without aura; Anxiety, generalized; Insomnia due to anxiety and fear; Cognitive deficit as late effect of traumatic brain injury (HCC); Vitamin D deficiency; Body mass index (BMI) of 19.0-19.9 in adult; Family history of cerebral aneurysm; and Left cavernous carotid aneurysm on their problem list.  Health Maintenance:   Immunization History  Administered Date(s) Administered   HPV Quadrivalent 03/02/2010   Influenza,inj,Quad PF,6+ Mos 01/31/2018, 01/07/2021   PFIZER(Purple Top)SARS-COV-2 Vaccination 06/22/2019, 07/17/2019   PPD Test 07/29/2017    Tetanus: patient states has had  <10 years, declines; plan to get age 55 Flu vaccine: TODAY  HPV: 2012 Covid 19: 2/2, 2021, pfizer  LMP: No LMP recorded. (Menstrual status: IUD). Pap: 07/2018 neg HPV by gyn Dr. Edward Jolly MGM: n/a  Colonoscopy: n/a EGD: n/a  Last Dental Exam: [facility], last 2022 Last Eye Exam: [facility], glasses, last 2022  Patient Care Team: Lucky Cowboy, MD as PCP - General (Internal Medicine) Amelia Jo, MD as Consulting Physician (Neurology) de Melchor Amour, Jerilynn Mages, MD as Consulting Physician (Radiology)  Surgical History:  She has a past surgical history that includes Wisdom tooth extraction; Tonsillectomy (N/A, 11/11/2016); and Intrauterine device (iud) insertion. Family History:  Herfamily history includes Allergies in her  mother; Aneurysm in her paternal grandfather; Anuerysm in her father; Stroke in her paternal grandfather. Social History:  She reports that she has never smoked. She has never used smokeless tobacco. She reports current alcohol use of about 2.0 standard drinks per week. She reports that she does not currently use drugs after having used the following drugs: Marijuana.  Review of Systems: Review of Systems  Constitutional:  Negative for malaise/fatigue and weight loss.  HENT:  Negative for hearing loss and tinnitus.   Eyes:  Negative for blurred vision and double vision.  Respiratory:  Negative for cough, shortness of breath and wheezing.   Cardiovascular:  Negative for chest pain, palpitations, orthopnea, claudication and leg swelling.  Gastrointestinal:  Negative for abdominal pain, blood in stool, constipation, diarrhea, heartburn, melena, nausea and vomiting.  Genitourinary: Negative.   Musculoskeletal:  Negative for joint pain and myalgias.  Skin:  Negative for rash.  Neurological:  Positive for headaches (recently progressed to daily headache, tension type, TMJ). Negative for dizziness, tingling, sensory change and weakness.  Endo/Heme/Allergies:  Negative for polydipsia.       Always cold  Psychiatric/Behavioral:  Negative for depression, memory loss, substance abuse and suicidal ideas. The patient is nervous/anxious and has insomnia.   All other systems reviewed and are negative.  Physical Exam: Estimated body mass index is 19.24 kg/m as calculated from the following:   Height as of this encounter: 5' 3.75" (1.619 m).   Weight as of this encounter: 111 lb 3.2 oz (50.4 kg). BP (!) 82/54   Pulse 92   Temp 97.7 F (36.5 C)   Ht 5' 3.75" (1.619 m)   Wt 111 lb 3.2 oz (50.4 kg)   SpO2 99%   BMI 19.24 kg/m  General Appearance: Slim young caucasian female, well dressed, good hygiene in no apparent distress.  Eyes: PERRLA, EOMs, conjunctiva no swelling or erythema Sinuses: No  Frontal/maxillary tenderness  ENT/Mouth: Ext aud canals clear, normal light reflex with TMs without erythema, bulging. LImited oral exam, pain with jaw opening in bil TMJ. No erythema, swelling, or exudate on visible post pharynx. Tonsils not visible. Hearing normal.  Neck: Supple, thyroid normal. No bruits  Respiratory: Respiratory effort normal, BS equal bilaterally without rales, rhonchi, wheezing or stridor.  Cardio: RRR without murmurs, rubs or gallops. Brisk peripheral pulses without edema.  Chest: symmetric, with normal excursions and percussion.  Breasts: defer to GYN Abdomen: Soft, nontender, no guarding, rebound, hernias, masses, or organomegaly.  Lymphatics: Non tender without lymphadenopathy.  Genitourinary: defer to GYN Musculoskeletal: Full ROM all peripheral extremities,5/5 strength, and normal gait.  Skin: Warm, dry without rashes, lesions, ecchymosis. Neuro: Cranial nerves intact, reflexes equal bilaterally. Normal muscle tone, no cerebellar symptoms. Sensation intact.  Psych: Awake and oriented X 3, anxious affect, Insight and Judgment appropriate.   EKG: WNL in 2015, defer  Dan Maker, NP 5:19 PM [facility]

## 2021-01-07 ENCOUNTER — Other Ambulatory Visit: Payer: Self-pay

## 2021-01-07 ENCOUNTER — Encounter: Payer: Self-pay | Admitting: Adult Health

## 2021-01-07 ENCOUNTER — Ambulatory Visit (INDEPENDENT_AMBULATORY_CARE_PROVIDER_SITE_OTHER): Payer: 59 | Admitting: Adult Health

## 2021-01-07 VITALS — BP 82/54 | HR 92 | Temp 97.7°F | Ht 63.75 in | Wt 111.2 lb

## 2021-01-07 DIAGNOSIS — M26623 Arthralgia of bilateral temporomandibular joint: Secondary | ICD-10-CM

## 2021-01-07 DIAGNOSIS — F409 Phobic anxiety disorder, unspecified: Secondary | ICD-10-CM

## 2021-01-07 DIAGNOSIS — F319 Bipolar disorder, unspecified: Secondary | ICD-10-CM

## 2021-01-07 DIAGNOSIS — Z0000 Encounter for general adult medical examination without abnormal findings: Secondary | ICD-10-CM

## 2021-01-07 DIAGNOSIS — J4520 Mild intermittent asthma, uncomplicated: Secondary | ICD-10-CM

## 2021-01-07 DIAGNOSIS — Z130 Encounter for screening for diseases of the blood and blood-forming organs and certain disorders involving the immune mechanism: Secondary | ICD-10-CM

## 2021-01-07 DIAGNOSIS — E559 Vitamin D deficiency, unspecified: Secondary | ICD-10-CM | POA: Diagnosis not present

## 2021-01-07 DIAGNOSIS — Z13220 Encounter for screening for lipoid disorders: Secondary | ICD-10-CM

## 2021-01-07 DIAGNOSIS — K589 Irritable bowel syndrome without diarrhea: Secondary | ICD-10-CM

## 2021-01-07 DIAGNOSIS — Z0001 Encounter for general adult medical examination with abnormal findings: Secondary | ICD-10-CM

## 2021-01-07 DIAGNOSIS — S069X0S Unspecified intracranial injury without loss of consciousness, sequela: Secondary | ICD-10-CM

## 2021-01-07 DIAGNOSIS — Z1329 Encounter for screening for other suspected endocrine disorder: Secondary | ICD-10-CM

## 2021-01-07 DIAGNOSIS — Z681 Body mass index (BMI) 19 or less, adult: Secondary | ICD-10-CM

## 2021-01-07 DIAGNOSIS — Z23 Encounter for immunization: Secondary | ICD-10-CM | POA: Diagnosis not present

## 2021-01-07 DIAGNOSIS — F068 Other specified mental disorders due to known physiological condition: Secondary | ICD-10-CM

## 2021-01-07 DIAGNOSIS — Z1389 Encounter for screening for other disorder: Secondary | ICD-10-CM

## 2021-01-07 DIAGNOSIS — G43009 Migraine without aura, not intractable, without status migrainosus: Secondary | ICD-10-CM

## 2021-01-07 DIAGNOSIS — Z13228 Encounter for screening for other metabolic disorders: Secondary | ICD-10-CM

## 2021-01-07 DIAGNOSIS — F988 Other specified behavioral and emotional disorders with onset usually occurring in childhood and adolescence: Secondary | ICD-10-CM

## 2021-01-07 DIAGNOSIS — I671 Cerebral aneurysm, nonruptured: Secondary | ICD-10-CM

## 2021-01-07 DIAGNOSIS — M26633 Articular disc disorder of bilateral temporomandibular joint: Secondary | ICD-10-CM

## 2021-01-07 DIAGNOSIS — Z79899 Other long term (current) drug therapy: Secondary | ICD-10-CM

## 2021-01-07 DIAGNOSIS — F411 Generalized anxiety disorder: Secondary | ICD-10-CM

## 2021-01-07 DIAGNOSIS — E538 Deficiency of other specified B group vitamins: Secondary | ICD-10-CM

## 2021-01-07 MED ORDER — ESCITALOPRAM OXALATE 5 MG PO TABS: 5.0000 mg | ORAL_TABLET | Freq: Every day | ORAL | 0 refills | Status: DC

## 2021-01-07 MED ORDER — CYCLOBENZAPRINE HCL 10 MG PO TABS: 5.0000 mg | ORAL_TABLET | Freq: Three times a day (TID) | ORAL | 0 refills | Status: DC | PRN

## 2021-01-07 NOTE — Patient Instructions (Addendum)
Escitalopram Tablets What is this medication? ESCITALOPRAM (es sye TAL oh pram) treats depression and anxiety. It increases the amount of serotonin in the brain, a hormone that helps regulate mood. It belongs to a group of medications called SSRIs. This medicine may be used for other purposes; ask your health care provider or pharmacist if you have questions. COMMON BRAND NAME(S): Lexapro What should I tell my care team before I take this medication? They need to know if you have any of these conditions: Bipolar disorder or a family history of bipolar disorder Diabetes Glaucoma Heart disease Kidney or liver disease Receiving electroconvulsive therapy Seizures Suicidal thoughts, plans, or attempt by you or a family member An unusual or allergic reaction to escitalopram, the related medication citalopram, other medications, foods, dyes, or preservatives Pregnant or trying to become pregnant Breast-feeding How should I use this medication? Take this medication by mouth with a glass of water. Follow the directions on the prescription label. You can take it with or without food. If it upsets your stomach, take it with food. Take your medication at regular intervals. Do not take it more often than directed. Do not stop taking this medication suddenly except upon the advice of your care team. Stopping this medication too quickly may cause serious side effects or your condition may worsen. A special MedGuide will be given to you by the pharmacist with each prescription and refill. Be sure to read this information carefully each time. Talk to your care team regarding the use of this medication in children. Special care may be needed. Overdosage: If you think you have taken too much of this medicine contact a poison control center or emergency room at once. NOTE: This medicine is only for you. Do not share this medicine with others. What if I miss a dose? If you miss a dose, take it as soon as you  can. If it is almost time for your next dose, take only that dose. Do not take double or extra doses. What may interact with this medication? Do not take this medication with any of the following: Certain medications for fungal infections like fluconazole, itraconazole, ketoconazole, posaconazole, voriconazole Cisapride Citalopram Dronedarone Linezolid MAOIs like Carbex, Eldepryl, Marplan, Nardil, and Parnate Methylene blue (injected into a vein) Pimozide Thioridazine This medication may also interact with the following: Alcohol Amphetamines Aspirin and aspirin-like medications Carbamazepine Certain medications for depression, anxiety, or psychotic disturbances Certain medications for migraine headache like almotriptan, eletriptan, frovatriptan, naratriptan, rizatriptan, sumatriptan, zolmitriptan Certain medications for sleep Certain medications that treat or prevent blood clots like warfarin, enoxaparin, dalteparin Cimetidine Diuretics Dofetilide Fentanyl Furazolidone Isoniazid Lithium Metoprolol NSAIDs, medications for pain and inflammation, like ibuprofen or naproxen Other medications that prolong the QT interval (cause an abnormal heart rhythm) Procarbazine Rasagiline Supplements like St. John's wort, kava kava, valerian Tramadol Tryptophan Ziprasidone This list may not describe all possible interactions. Give your health care provider a list of all the medicines, herbs, non-prescription drugs, or dietary supplements you use. Also tell them if you smoke, drink alcohol, or use illegal drugs. Some items may interact with your medicine. What should I watch for while using this medication? Tell your care team if your symptoms do not get better or if they get worse. Visit your care team for regular checks on your progress. Because it may take several weeks to see the full effects of this medication, it is important to continue your treatment as prescribed by your care  team. Watch for new or worsening   thoughts of suicide or depression. This includes sudden changes in mood, behaviors, or thoughts. These changes can happen at any time but are more common in the beginning of treatment or after a change in dose. Call your care team right away if you experience these thoughts or worsening depression. Manic episodes may happen in patients with bipolar disorder who take this medication. Watch for changes in feelings or behaviors such as feeling anxious, nervous, agitated, panicky, irritable, hostile, aggressive, impulsive, severely restless, overly excited and hyperactive, or trouble sleeping. These symptoms can happen at any time but are more common in the beginning of treatment or after a change in dose. Call your care team right away if you notice any of these symptoms. You may get drowsy or dizzy. Do not drive, use machinery, or do anything that needs mental alertness until you know how this medication affects you. Do not stand or sit up quickly, especially if you are an older patient. This reduces the risk of dizzy or fainting spells. Alcohol may interfere with the effect of this medication. Avoid alcoholic drinks. Your mouth may get dry. Chewing sugarless gum or sucking hard candy, and drinking plenty of water may help. Contact your care team if the problem does not go away or is severe. What side effects may I notice from receiving this medication? Side effects that you should report to your care team as soon as possible: Allergic reactions--skin rash, itching, hives, swelling of the face, lips, tongue, or throat Bleeding--bloody or black, tar-like stools, red or dark brown urine, vomiting blood or brown material that looks like coffee grounds, small, red or purple spots on skin, unusual bleeding or bruising Heart rhythm changes--fast or irregular heartbeat, dizziness, feeling faint or lightheaded, chest pain, trouble breathing Low sodium level--muscle weakness, fatigue,  dizziness, headache, confusion Serotonin syndrome--irritability, confusion, fast or irregular heartbeat, muscle stiffness, twitching muscles, sweating, high fever, seizure, chills, vomiting, diarrhea Sudden eye pain or change in vision such as blurry vision, seeing halos around lights, vision loss Thoughts of suicide or self-harm, worsening mood, feelings of depression Side effects that usually do not require medical attention (report to your care team if they continue or are bothersome): Change in sex drive or performance Diarrhea Excessive sweating Nausea Tremors or shaking Upset stomach This list may not describe all possible side effects. Call your doctor for medical advice about side effects. You may report side effects to FDA at 1-800-FDA-1088. Where should I keep my medication? Keep out of reach of children and pets. Store at room temperature between 15 and 30 degrees C (59 and 86 degrees F). Throw away any unused medication after the expiration date. NOTE: This sheet is a summary. It may not cover all possible information. If you have questions about this medicine, talk to your doctor, pharmacist, or health care provider.  2022 Elsevier/Gold Standard (2020-02-05 00:00:00)    Dizziness Dizziness is a common problem. It is a feeling of unsteadiness or light-headedness. You may feel like you are about to faint. Dizziness can lead to injury if you stumble or fall. Anyone can become dizzy, but dizziness is more common in older adults. This condition can be caused by a number of things, including medicines, dehydration, or illness. Follow these instructions at home: Eating and drinking  Drink enough fluid to keep your urine pale yellow. This helps to keep you from becoming dehydrated. Try to drink more clear fluids, such as water. Do not drink alcohol. Limit your caffeine intake if told to  do so by your health care provider. Check ingredients and nutrition facts to see if a food or  beverage contains caffeine. Limit your salt (sodium) intake if told to do so by your health care provider. Check ingredients and nutrition facts to see if a food or beverage contains sodium. Activity  Avoid making quick movements. Rise slowly from chairs and steady yourself until you feel okay. In the morning, first sit up on the side of the bed. When you feel okay, stand slowly while you hold onto something until you know that your balance is good. If you need to stand in one place for a long time, move your legs often. Tighten and relax the muscles in your legs while you are standing. Do not drive or use machinery if you feel dizzy. Avoid bending down if you feel dizzy. Place items in your home so that they are easy for you to reach without leaning over. Lifestyle Do not use any products that contain nicotine or tobacco. These products include cigarettes, chewing tobacco, and vaping devices, such as e-cigarettes. If you need help quitting, ask your health care provider. Try to reduce your stress level by using methods such as yoga or meditation. Talk with your health care provider if you need help to manage your stress. General instructions Watch your dizziness for any changes. Take over-the-counter and prescription medicines only as told by your health care provider. Talk with your health care provider if you think that your dizziness is caused by a medicine that you are taking. Tell a friend or a family member that you are feeling dizzy. If he or she notices any changes in your behavior, have this person call your health care provider. Keep all follow-up visits. This is important. Contact a health care provider if: Your dizziness does not go away or you have new symptoms. Your dizziness or light-headedness gets worse. You feel nauseous. You have reduced hearing. You have a fever. You have neck pain or a stiff neck. Your dizziness leads to an injury or a fall. Get help right away  if: You vomit or have diarrhea and are unable to eat or drink anything. You have problems talking, walking, swallowing, or using your arms, hands, or legs. You feel generally weak. You have any bleeding. You are not thinking clearly or you have trouble forming sentences. It may take a friend or family member to notice this. You have chest pain, abdominal pain, shortness of breath, or sweating. Your vision changes or you develop a severe headache. These symptoms may represent a serious problem that is an emergency. Do not wait to see if the symptoms will go away. Get medical help right away. Call your local emergency services (911 in the [place] Do not drive yourself to the hospital. Summary Dizziness is a feeling of unsteadiness or light-headedness. This condition can be caused by a number of things, including medicines, dehydration, or illness. Anyone can become dizzy, but dizziness is more common in older adults. Drink enough fluid to keep your urine pale yellow. Do not drink alcohol. Avoid making quick movements if you feel dizzy. Monitor your dizziness for any changes. This information is not intended to replace advice given to you by your health care provider. Make sure you discuss any questions you have with your health care provider. Document Revised: 01/22/2020 Document Reviewed: 01/22/2020 Elsevier Patient Education  2022 [employer].

## 2021-01-08 ENCOUNTER — Encounter: Payer: Self-pay | Admitting: Adult Health

## 2021-01-08 DIAGNOSIS — E538 Deficiency of other specified B group vitamins: Secondary | ICD-10-CM | POA: Insufficient documentation

## 2021-01-08 LAB — CBC WITH DIFFERENTIAL/PLATELET
Absolute Monocytes: 730 cells/uL (ref 200–950)
Basophils Absolute: 8 cells/uL (ref 0–200)
Basophils Relative: 0.1 %
Eosinophils Absolute: 133 cells/uL (ref 15–500)
Eosinophils Relative: 1.6 %
HCT: 39.4 % (ref 35.0–45.0)
Hemoglobin: 13.1 g/dL (ref 11.7–15.5)
Lymphs Abs: 3370 cells/uL (ref 850–3900)
MCH: 29.4 pg (ref 27.0–33.0)
MCHC: 33.2 g/dL (ref 32.0–36.0)
MCV: 88.5 fL (ref 80.0–100.0)
MPV: 9.6 fL (ref 7.5–12.5)
Monocytes Relative: 8.8 %
Neutro Abs: 4059 cells/uL (ref 1500–7800)
Neutrophils Relative %: 48.9 %
Platelets: 380 10*3/uL (ref 140–400)
RBC: 4.45 10*6/uL (ref 3.80–5.10)
RDW: 12 % (ref 11.0–15.0)
Total Lymphocyte: 40.6 %
WBC: 8.3 10*3/uL (ref 3.8–10.8)

## 2021-01-08 LAB — URINALYSIS, ROUTINE W REFLEX MICROSCOPIC
Bilirubin Urine: NEGATIVE
Glucose, UA: NEGATIVE
Hgb urine dipstick: NEGATIVE
Ketones, ur: NEGATIVE
Leukocytes,Ua: NEGATIVE
Nitrite: NEGATIVE
Protein, ur: NEGATIVE
Specific Gravity, Urine: 1.014 (ref 1.001–1.035)
pH: 7.5 (ref 5.0–8.0)

## 2021-01-08 LAB — COMPLETE METABOLIC PANEL WITH GFR
AG Ratio: 2.1 (calc) (ref 1.0–2.5)
ALT: 9 U/L (ref 6–29)
AST: 15 U/L (ref 10–30)
Albumin: 5.1 g/dL (ref 3.6–5.1)
Alkaline phosphatase (APISO): 48 U/L (ref 31–125)
BUN: 15 mg/dL (ref 7–25)
CO2: 27 mmol/L (ref 20–32)
Calcium: 10.4 mg/dL — ABNORMAL HIGH (ref 8.6–10.2)
Chloride: 103 mmol/L (ref 98–110)
Creat: 0.65 mg/dL (ref 0.50–0.96)
Globulin: 2.4 g/dL (calc) (ref 1.9–3.7)
Glucose, Bld: 83 mg/dL (ref 65–99)
Potassium: 5 mmol/L (ref 3.5–5.3)
Sodium: 139 mmol/L (ref 135–146)
Total Bilirubin: 0.3 mg/dL (ref 0.2–1.2)
Total Protein: 7.5 g/dL (ref 6.1–8.1)
eGFR: 124 mL/min/{1.73_m2} (ref >=60)

## 2021-01-08 LAB — MAGNESIUM: Magnesium: 2.1 mg/dL (ref 1.5–2.5)

## 2021-01-08 LAB — VITAMIN D 25 HYDROXY (VIT D DEFICIENCY, FRACTURES): Vit D, 25-Hydroxy: 30 ng/mL (ref 30–100)

## 2021-01-08 LAB — VITAMIN B12: Vitamin B-12: 404 pg/mL (ref 200–1100)

## 2021-01-08 LAB — TSH: TSH: 2.26 mIU/L

## 2021-01-09 ENCOUNTER — Other Ambulatory Visit: Payer: Self-pay

## 2021-01-09 ENCOUNTER — Ambulatory Visit
Admission: RE | Admit: 2021-01-09 | Discharge: 2021-01-09 | Disposition: A | Discharge status: Home / Self Care | Payer: 59 | Source: Ambulatory Visit | Attending: Adult Health | Admitting: Adult Health

## 2021-01-09 DIAGNOSIS — M26623 Arthralgia of bilateral temporomandibular joint: Secondary | ICD-10-CM

## 2021-01-09 DIAGNOSIS — M26633 Articular disc disorder of bilateral temporomandibular joint: Secondary | ICD-10-CM

## 2021-01-16 ENCOUNTER — Encounter: Payer: Self-pay | Admitting: Adult Health

## 2021-01-16 DIAGNOSIS — S0300XA Dislocation of jaw, unspecified side, initial encounter: Secondary | ICD-10-CM | POA: Insufficient documentation

## 2021-01-16 DIAGNOSIS — G44209 Tension-type headache, unspecified, not intractable: Secondary | ICD-10-CM | POA: Insufficient documentation

## 2021-01-16 DIAGNOSIS — M542 Cervicalgia: Secondary | ICD-10-CM | POA: Insufficient documentation

## 2021-02-12 ENCOUNTER — Other Ambulatory Visit: Payer: Self-pay | Admitting: Nurse Practitioner

## 2021-02-12 ENCOUNTER — Other Ambulatory Visit: Payer: Self-pay | Admitting: Adult Health

## 2021-02-12 DIAGNOSIS — F988 Other specified behavioral and emotional disorders with onset usually occurring in childhood and adolescence: Secondary | ICD-10-CM

## 2021-02-12 MED ORDER — ALPRAZOLAM 0.5 MG PO TABS: ORAL_TABLET | ORAL | 0 refills | Status: DC

## 2021-02-12 MED ORDER — AMPHETAMINE-DEXTROAMPHETAMINE 20 MG PO TABS: 20.0000 mg | ORAL_TABLET | ORAL | 0 refills | Status: DC

## 2021-03-20 ENCOUNTER — Ambulatory Visit: Payer: Self-pay | Admitting: Adult Health

## 2021-03-20 HISTORY — PX: OTHER SURGICAL HISTORY: SHX169

## 2021-03-27 ENCOUNTER — Encounter: Payer: Self-pay | Admitting: Adult Health

## 2021-04-02 ENCOUNTER — Other Ambulatory Visit: Payer: Self-pay | Admitting: Adult Health

## 2021-04-24 ENCOUNTER — Ambulatory Visit: Payer: Self-pay | Admitting: Radiology

## 2021-05-02 ENCOUNTER — Ambulatory Visit (INDEPENDENT_AMBULATORY_CARE_PROVIDER_SITE_OTHER): Payer: BC Managed Care – PPO | Admitting: Radiology

## 2021-05-02 ENCOUNTER — Other Ambulatory Visit: Payer: Self-pay

## 2021-05-02 ENCOUNTER — Encounter: Payer: Self-pay | Admitting: Radiology

## 2021-05-02 ENCOUNTER — Other Ambulatory Visit (HOSPITAL_COMMUNITY)
Admission: RE | Admit: 2021-05-02 | Discharge: 2021-05-02 | Disposition: A | Discharge status: Home / Self Care | Payer: Self-pay | Source: Ambulatory Visit | Attending: Radiology | Admitting: Radiology

## 2021-05-02 VITALS — BP 116/60 | Ht 63.0 in | Wt 115.0 lb

## 2021-05-02 DIAGNOSIS — Z01419 Encounter for gynecological examination (general) (routine) without abnormal findings: Secondary | ICD-10-CM | POA: Diagnosis not present

## 2021-05-02 DIAGNOSIS — Z3009 Encounter for other general counseling and advice on contraception: Secondary | ICD-10-CM | POA: Diagnosis not present

## 2021-05-02 NOTE — Progress Notes (Signed)
? ?  Hannah Reeves 1995-12-08 [id number] ? ? ?History:  26 y.o. G0 presents for annual exam. Doing well, was considering taking paragard out early but was unsure what she plans to use for prevention of pregnancy otherwise. Boyfriend has testicular cancer. ? ?Gynecologic History ?Patient's last menstrual period was 04/11/2021. ?Period Cycle (Days): 28 ?Period Duration (Days): 5 ?Period Pattern: Regular ?Menstrual Flow: Moderate, Light ?Dysmenorrhea: (!) Moderate ?Dysmenorrhea Symptoms: Cramping ?Contraception/Family planning: IUD ?Sexually active: yes ?Last Pap: 2020. Results were: normal ? ?Obstetric History ?OB History  ?Gravida Para Term Preterm AB Living  ?0 0 0 0 0 0  ?SAB IAB Ectopic Multiple Live Births  ?0 0 0 0    ? ? ? ?The following portions of the patient's history were reviewed and updated as appropriate: allergies, current medications, past family history, past medical history, past social history, past surgical history, and problem list. ? ?Review of Systems ?Pertinent items noted in HPI and remainder of comprehensive ROS otherwise negative.  ? ?Past medical history, past surgical history, family history and social history were all reviewed and documented in the EPIC chart. ? ? ?Exam: ? ?Vitals:  ? 05/02/21 1425  ?BP: 116/60  ?Weight: 115 lb (52.2 kg)  ?Height: 5\' 3"  (1.6 m)  ? ?Body mass index is 20.37 kg/m?. ? ?General appearance:  Normal ?Thyroid:  Symmetrical, normal in size, without palpable masses or nodularity. ?Respiratory ? Auscultation:  Clear without wheezing or rhonchi ?Cardiovascular ? Auscultation:  Regular rate, without rubs, murmurs or gallops ? Edema/varicosities:  Not grossly evident ?Abdominal ? Soft,nontender, without masses, guarding or rebound. ? Liver/spleen:  No organomegaly noted ? Hernia:  None appreciated ? Skin ? Inspection:  Grossly normal ?Breasts: Examined lying and sitting.  ? Right: Without masses, retractions, nipple discharge or axillary adenopathy. ? ? Left: Without  masses, retractions, nipple discharge or axillary adenopathy. ?Genitourinary  ? Inguinal/mons:  Normal without inguinal adenopathy ? External genitalia:  Normal appearing vulva with no masses, tenderness, or lesions ? BUS/Urethra/Skene's glands:  Normal without masses or exudate ? Vagina:  Normal appearing with normal color and discharge, no lesions ? Cervix:  Normal appearing without discharge or lesions ? Uterus:  Normal in size, shape and contour.  Mobile, nontender ? Adnexa/parametria:   ?  Rt: Normal in size, without masses or tenderness. ?  Lt: Normal in size, without masses or tenderness. ? Anus and perineum: Normal ?  ? ?Patient informed chaperone available to be present for breast and pelvic exam. Patient has requested no chaperone to be present. Patient has been advised what will be completed during breast and pelvic exam.  ? ?Assessment/Plan:   ?1. Well woman exam with routine gynecological exam ? ?- Cytology - PAP( [place]) ? ?2. Counseling for birth control regarding intrauterine device (IUD) ?Discussed other BC options, desires to keep paragard for now, will call if she changes her mind  ? ? ?Discussed SBE, pap and STI screening as directed/appropriate. Recommend of exercise weekly, including weight bearing exercise. Encouraged the use of seatbelts and sunscreen. ?Return in 1 year for annual or as needed.  ? ? B WHNP-BC 2:36 PM 05/02/2021  ?

## 2021-05-05 LAB — CYTOLOGY - PAP: Diagnosis: NEGATIVE

## 2021-05-15 ENCOUNTER — Other Ambulatory Visit: Payer: Self-pay | Admitting: Adult Health

## 2021-05-15 ENCOUNTER — Other Ambulatory Visit: Payer: Self-pay | Admitting: Nurse Practitioner

## 2021-05-16 MED ORDER — AMPHETAMINE-DEXTROAMPHETAMINE 20 MG PO TABS: 20.0000 mg | ORAL_TABLET | ORAL | 0 refills | Status: DC

## 2021-05-16 MED ORDER — CYCLOBENZAPRINE HCL 10 MG PO TABS: 5.0000 mg | ORAL_TABLET | Freq: Three times a day (TID) | ORAL | 0 refills | Status: DC | PRN

## 2021-05-16 MED ORDER — ALPRAZOLAM 0.5 MG PO TABS
ORAL_TABLET | ORAL | 0 refills | Status: DC
Start: 2021-05-16 — End: 2021-08-07

## 2021-06-23 DIAGNOSIS — R519 Headache, unspecified: Secondary | ICD-10-CM | POA: Diagnosis not present

## 2021-06-23 DIAGNOSIS — R200 Anesthesia of skin: Secondary | ICD-10-CM | POA: Diagnosis not present

## 2021-07-16 ENCOUNTER — Ambulatory Visit: Payer: BC Managed Care – PPO | Admitting: Adult Health

## 2021-08-07 ENCOUNTER — Other Ambulatory Visit: Payer: Self-pay | Admitting: Adult Health

## 2021-08-07 MED ORDER — ALPRAZOLAM 0.5 MG PO TABS: ORAL_TABLET | ORAL | 0 refills | Status: DC

## 2021-08-07 MED ORDER — CYCLOBENZAPRINE HCL 10 MG PO TABS: 5.0000 mg | ORAL_TABLET | Freq: Three times a day (TID) | ORAL | 0 refills | Status: DC | PRN

## 2021-10-10 ENCOUNTER — Other Ambulatory Visit: Payer: Self-pay | Admitting: Nurse Practitioner

## 2021-10-10 ENCOUNTER — Encounter: Payer: Self-pay | Admitting: Internal Medicine

## 2021-10-10 MED ORDER — CYCLOBENZAPRINE HCL 10 MG PO TABS: ORAL_TABLET | ORAL | 3 refills | Status: DC

## 2021-10-11 ENCOUNTER — Other Ambulatory Visit: Payer: Self-pay | Admitting: Internal Medicine

## 2021-10-11 DIAGNOSIS — F988 Other specified behavioral and emotional disorders with onset usually occurring in childhood and adolescence: Secondary | ICD-10-CM

## 2021-10-11 MED ORDER — AMPHETAMINE-DEXTROAMPHETAMINE 20 MG PO TABS: ORAL_TABLET | ORAL | 0 refills | Status: DC

## 2021-11-05 ENCOUNTER — Telehealth (HOSPITAL_COMMUNITY): Payer: Self-pay

## 2021-11-05 ENCOUNTER — Other Ambulatory Visit (HOSPITAL_COMMUNITY): Payer: Self-pay | Admitting: Neuroradiology

## 2021-11-05 DIAGNOSIS — I729 Aneurysm of unspecified site: Secondary | ICD-10-CM

## 2021-11-05 NOTE — Telephone Encounter (Signed)
Called to schedule mra, no answer, left vm. AW  ?

## 2021-11-10 ENCOUNTER — Other Ambulatory Visit: Payer: Self-pay | Admitting: Nurse Practitioner

## 2021-11-10 ENCOUNTER — Encounter: Payer: Self-pay | Admitting: Nurse Practitioner

## 2021-11-11 MED ORDER — LAMOTRIGINE 100 MG PO TABS: ORAL_TABLET | ORAL | 1 refills | Status: DC

## 2021-11-13 ENCOUNTER — Ambulatory Visit: Payer: BC Managed Care – PPO | Admitting: Nurse Practitioner

## 2021-11-13 ENCOUNTER — Encounter: Payer: Self-pay | Admitting: Nurse Practitioner

## 2021-11-13 VITALS — BP 104/62 | HR 103 | Temp 97.5°F | Ht 63.0 in | Wt 125.0 lb

## 2021-11-13 DIAGNOSIS — E538 Deficiency of other specified B group vitamins: Secondary | ICD-10-CM

## 2021-11-13 DIAGNOSIS — Z8249 Family history of ischemic heart disease and other diseases of the circulatory system: Secondary | ICD-10-CM | POA: Diagnosis not present

## 2021-11-13 DIAGNOSIS — F988 Other specified behavioral and emotional disorders with onset usually occurring in childhood and adolescence: Secondary | ICD-10-CM

## 2021-11-13 DIAGNOSIS — F409 Phobic anxiety disorder, unspecified: Secondary | ICD-10-CM

## 2021-11-13 DIAGNOSIS — F068 Other specified mental disorders due to known physiological condition: Secondary | ICD-10-CM | POA: Diagnosis not present

## 2021-11-13 DIAGNOSIS — F319 Bipolar disorder, unspecified: Secondary | ICD-10-CM

## 2021-11-13 DIAGNOSIS — F411 Generalized anxiety disorder: Secondary | ICD-10-CM | POA: Diagnosis not present

## 2021-11-13 DIAGNOSIS — J4520 Mild intermittent asthma, uncomplicated: Secondary | ICD-10-CM

## 2021-11-13 DIAGNOSIS — Z1329 Encounter for screening for other suspected endocrine disorder: Secondary | ICD-10-CM

## 2021-11-13 DIAGNOSIS — I671 Cerebral aneurysm, nonruptured: Secondary | ICD-10-CM

## 2021-11-13 DIAGNOSIS — E559 Vitamin D deficiency, unspecified: Secondary | ICD-10-CM | POA: Diagnosis not present

## 2021-11-13 DIAGNOSIS — G43009 Migraine without aura, not intractable, without status migrainosus: Secondary | ICD-10-CM

## 2021-11-13 DIAGNOSIS — F5105 Insomnia due to other mental disorder: Secondary | ICD-10-CM

## 2021-11-13 DIAGNOSIS — E349 Endocrine disorder, unspecified: Secondary | ICD-10-CM

## 2021-11-13 DIAGNOSIS — R4189 Other symptoms and signs involving cognitive functions and awareness: Secondary | ICD-10-CM

## 2021-11-13 DIAGNOSIS — Z79899 Other long term (current) drug therapy: Secondary | ICD-10-CM

## 2021-11-13 MED ORDER — ALPRAZOLAM 0.5 MG PO TABS: ORAL_TABLET | ORAL | 0 refills | Status: DC

## 2021-11-13 NOTE — Progress Notes (Unsigned)
Follow Up  Assessment and Plan:  Hannah Reeves was seen today for general follow up.  Diagnoses and all orders for this visit:  Left cavernous carotid aneurysm Following with Dr. Dia Crawford Rodriques   TMJ/ tension headaches Frequent headaches Increased recent headaches - tension, TMJ Saw specialist who recommended TMJ MRI Heat to neck, massage, gentle stretching Avoid hard foods Stress management techniques discussed, increase water, good sleep hygiene discussed, increase exercise, and increase veggies.   Mild intermittent asthma without complication No recent flares; monitor; avoid triggers  Irritable bowel syndrome, unspecified type Working on lifestyle; if not on soluble fiber add if having flares.   Insomnia due to anxiety and fear Managing with benadryl/xanax PRN Stress reduction and good sleep hygiene discussed, increase day time activity, try melatonin or benadryl if this does not help we will call in sleep medication.   H/O cold sores Continue PRN antiviral   Bipolar depression (HCC) Continue medications. Encouraged follow up with counselor; sig work related stress Continue with CBT management once counselor is confirmed.  Anxiety, generalized Continue medications. Stress management techniques discussed, plans to get new job Encourage counseling increase water, good sleep hygiene discussed, increase exercise, and increase veggies.   Attention deficit disorder (ADD) without hyperactivity Continue medications Helps with focus, no AE's. The patient was counseled on the addictive nature of the medication and was encouraged to take drug holidays when not needed.   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 Recommended diet heavy in fruits and veggies, omega 3's. Decrease consumption of animal meats, cheeses, and dairy  products. Remain active and exercise as tolerated. Continue to monitor. Check lipids/TSH  B12 def Monitor levels  Screening for thyroid disorder Check and monitor TSH  Medication management All medications discussed and reviewed in full. All questions and concerns regarding medications addressed.    Orders Placed This Encounter  Procedures   CBC with Differential/Platelet   COMPLETE METABOLIC PANEL WITH GFR   TSH   VITAMIN D 25 Hydroxy (Vit-D Deficiency, Fractures)   Vitamin B12      Discussed med's effects and SE's. Screening labs and tests as requested with regular follow-up as recommended. Over 40 minutes of exam, counseling, chart review, and complex, high level critical decision making was performed this visit.   Future Appointments  Date Time Provider Department Center  12/02/2021 12:00 PM [facility] 3 [facility]  01/13/2022  9:00 AM Courtland Reas, Archie Patten, NP [facility] None      HPI  26 y.o. female  presents for a complete physical and follow up for has Asthma; IBS (irritable bowel syndrome); H/O cold sores; ADD (attention deficit disorder); Bipolar depression (HCC); Migraine without aura; Anxiety, generalized; Insomnia due to anxiety and fear; Cognitive deficit as late effect of traumatic brain injury (HCC); Vitamin D deficiency; Body mass index (BMI) of 19.0-19.9 in adult; Family history of cerebral aneurysm; Left cavernous carotid aneurysm; B12 deficiency; TMJ dislocation; Tension headache; and Myofascial neck pain on their problem list. 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   She has boyfriend of 3 years, good relationship, has recently been diagnosed with cancer.    She does report work in stressful  She has been having more HA, tension/TMJ flare for several months, bilateral, "band around head", pain with opening jaw, chewing. Just saw specialist  Dr.  Odette Horns DDS today who recommended we order TMJ MRI to forward, he is out of network for her. Not eating hard foods, painful to open mouth much.   Father and PGF had aneurysms; patient had MRA in 09/2020 that showed small 2 x 3 mm aneurysm of the proximal left cavernous carotid. She is now following with Dr. Blair Dolphin Rodrique.  She was initiated in 2020 on lamotrigine due to labile mood and depressive episodes with suspicion for type 2 bipolar disorder. Taking 200 mg daily with perceived benefit, improved mood, less lability, fewer outbursts. Stopped trazodone/buspar, was declining SSRI, Takes PRN xanax for anxiety (30 tabs last 2 months) but admits using more with work stress. Was working with therapist but wasn't good fit, has been too busy to find a new one.   She is on adderall for ADD for many years; takes as needed for work.  She is aware this may make anxiety worse, but feels strongly she needs for mood and focus. Hasn't noted worse anxiety on days she takes.   BMI is Body mass index is 22.14 kg/m.,  She is staying at home with parents and brother.   Wt Readings from Last 3 Encounters:  11/13/21 125 lb (56.7 kg)  05/02/21 115 lb (52.2 kg)  01/07/21 111 lb 3.2 oz (50.4 kg)   Today their BP is BP: 104/62 She does workout. She denies chest pain, shortness of breath. She does have episodes of dizziness.  \ She is not on cholesterol medication and denies myalgias. Her cholesterol is not at goal. The cholesterol last visit was:   Lab Results  Component Value Date   CHOL 178 09/18/2019   HDL 67 09/18/2019   LDLCALC 97 09/18/2019   TRIG 58 09/18/2019   CHOLHDL 2.7 09/18/2019   She has been working on diet and exercise for glucose management. Last A1C in the office was:  Lab Results  Component Value Date   HGBA1C 5.2 06/28/2013   Last GFR: Lab Results  Component Value Date   GFRNONAA >60 02/22/2020   Patient is currently on Vitamin D supplement, takes unkonwn dose  irregularly.  Lab Results  Component Value Date   VD25OH 30 01/07/2021     She is not taking supplement  Lab Results  Component Value Date   [id number] 01/07/2021      Current Medications:  Current Outpatient Medications on File Prior to Visit  Medication Sig Dispense Refill   acetaminophen (TYLENOL) 325 MG tablet Take 650 mg by mouth every 6 (six) hours as needed (pain).     ALPRAZolam (XANAX) 0.5 MG tablet TAKE 1/2 TO 1 TABLET 2 - 3 TIMES DAILY ONLY IF NEEDED FOR SEVERE ANXIETY/PANIC *MAX 5 DAYS PER WEEK* 30 tablet 0   amphetamine-dextroamphetamine (ADDERALL) 20 MG tablet Take 1/2 - 1 tablet 2 x /day as needed for ADD, Focus & Concentration &  limit to 5 days /week to avoid Addiction 60 tablet 0   cyclobenzaprine (FLEXERIL) 10 MG tablet Take  1/2 to 1 tablet   2-3 x /day  as needed for Muscle Spasms 90 tablet 3   ibuprofen (ADVIL) 600 MG tablet Take 1 tablet (600 mg total) by mouth every 8 (eight) hours as needed. 60 tablet 1   lamoTRIgine (LAMICTAL) 100 MG tablet TAKE 1 TABLET BY MOUTH TWICE A DAY FOR MOOD 180 tablet 1   cetirizine (ZYRTEC) 10 MG tablet Take 10 mg by mouth as needed for allergies. (Patient not taking:  Reported on 05/02/2021)     escitalopram (LEXAPRO) 5 MG tablet Take 1 tablet (5 mg total) by mouth daily. (Patient not taking: Reported on 05/02/2021) 90 tablet 0   No current facility-administered medications on file prior to visit.   Allergies:  Allergies  Allergen Reactions   Peanut-Containing Drug Products Shortness Of Breath and Itching    TREE NUTS - ITCHING OF MOUTH    Shellfish Allergy Shortness Of Breath and Itching    ITCHING OF MOUTH   Penicillins Hives   Sulfa Antibiotics Hives   Medical History:  She has Asthma; IBS (irritable bowel syndrome); H/O cold sores; ADD (attention deficit disorder); Bipolar depression (HCC); Migraine without aura; Anxiety, generalized; Insomnia due to anxiety and fear; Cognitive deficit as late effect of traumatic brain  injury (HCC); Vitamin D deficiency; Body mass index (BMI) of 19.0-19.9 in adult; Family history of cerebral aneurysm; Left cavernous carotid aneurysm; B12 deficiency; TMJ dislocation; Tension headache; and Myofascial neck pain on their problem list.  Health Maintenance:   Immunization History  Administered Date(s) Administered   HPV Quadrivalent 03/02/2010   Influenza,inj,Quad PF,6+ Mos 01/31/2018, 01/07/2021   PFIZER(Purple Top)SARS-COV-2 Vaccination 06/22/2019, 07/17/2019   PPD Test 07/29/2017    Patient Care Team: Lucky Cowboy, MD as PCP - General (Internal Medicine) Amelia Jo, MD as Consulting Physician (Neurology) de Melchor Amour, Jerilynn Mages, MD as Consulting Physician (Radiology)  Surgical History:  She has a past surgical history that includes Wisdom tooth extraction; Tonsillectomy (N/A, 11/11/2016); Intrauterine device (iud) insertion; and arthrocentensis (Left, 03/20/2021). Family History:  Herfamily history includes Allergies in her mother; Aneurysm in her paternal grandfather; Anuerysm in her father; Stroke in her paternal grandfather. Social History:  She reports that she has quit smoking. Her smoking use included cigarettes. She has never used smokeless tobacco. She reports current alcohol use of about 2.0 standard drinks of alcohol per week. She reports that she does not currently use drugs after having used the following drugs: Marijuana.  Review of Systems: Review of Systems  Constitutional:  Negative for malaise/fatigue and weight loss.  HENT:  Negative for hearing loss and tinnitus.   Eyes:  Negative for blurred vision and double vision.  Respiratory:  Negative for cough, shortness of breath and wheezing.   Cardiovascular:  Negative for chest pain, palpitations, orthopnea, claudication and leg swelling.  Gastrointestinal:  Negative for abdominal pain, blood in stool, constipation, diarrhea, heartburn, melena, nausea and vomiting.  Genitourinary: Negative.    Musculoskeletal:  Negative for joint pain and myalgias.  Skin:  Negative for rash.  Neurological:  Positive for headaches (recently progressed to daily headache, tension type, TMJ). Negative for dizziness, tingling, sensory change and weakness.  Endo/Heme/Allergies:  Negative for polydipsia.       Always cold  Psychiatric/Behavioral:  Negative for depression, memory loss, substance abuse and suicidal ideas. The patient is nervous/anxious and has insomnia.   All other systems reviewed and are negative.   Physical Exam: Estimated body mass index is 22.14 kg/m as calculated from the following:   Height as of this encounter: 5\' 3"  (1.6 m).   Weight as of this encounter: 125 lb (56.7 kg). BP 104/62   Pulse (!) 103   Temp (!) 97.5 F (36.4 C)   Ht 5\' 3"  (1.6 m)   Wt 125 lb (56.7 kg)   SpO2 98%   BMI 22.14 kg/m  General Appearance: Slim young caucasian female, well dressed, good hygiene in no apparent distress.  Eyes: PERRLA, EOMs, conjunctiva no swelling or  erythema Sinuses: No Frontal/maxillary tenderness  ENT/Mouth: Ext aud canals clear, normal light reflex with TMs without erythema, bulging. LImited oral exam, pain with jaw opening in bil TMJ. No erythema, swelling, or exudate on visible post pharynx. Tonsils not visible. Hearing normal.  Neck: Supple, thyroid normal. No bruits  Respiratory: Respiratory effort normal, BS equal bilaterally without rales, rhonchi, wheezing or stridor.  Cardio: RRR without murmurs, rubs or gallops. Brisk peripheral pulses without edema.  Chest: symmetric, with normal excursions and percussion.  Breasts: defer to GYN Abdomen: Soft, nontender, no guarding, rebound, hernias, masses, or organomegaly.  Lymphatics: Non tender without lymphadenopathy.  Genitourinary: defer to GYN Musculoskeletal: Full ROM all peripheral extremities,5/5 strength, and normal gait.  Skin: Warm, dry without rashes, lesions, ecchymosis. Neuro: Cranial nerves intact, reflexes  equal bilaterally. Normal muscle tone, no cerebellar symptoms. Sensation intact.  Psych: Awake and oriented X 3, anxious affect, Insight and Judgment appropriate.     Hannah Jump, NP 2:15 PM [facility]

## 2021-11-13 NOTE — Patient Instructions (Signed)

## 2021-11-14 LAB — CBC WITH DIFFERENTIAL/PLATELET
Absolute Monocytes: 703 cells/uL (ref 200–950)
Basophils Absolute: 7 cells/uL (ref 0–200)
Basophils Relative: 0.1 %
Eosinophils Absolute: 71 cells/uL (ref 15–500)
Eosinophils Relative: 1 %
HCT: 40.2 % (ref 35.0–45.0)
Hemoglobin: 14.1 g/dL (ref 11.7–15.5)
Lymphs Abs: 2599 cells/uL (ref 850–3900)
MCH: 31.7 pg (ref 27.0–33.0)
MCHC: 35.1 g/dL (ref 32.0–36.0)
MCV: 90.3 fL (ref 80.0–100.0)
MPV: 9.1 fL (ref 7.5–12.5)
Monocytes Relative: 9.9 %
Neutro Abs: 3720 cells/uL (ref 1500–7800)
Neutrophils Relative %: 52.4 %
Platelets: 395 10*3/uL (ref 140–400)
RBC: 4.45 10*6/uL (ref 3.80–5.10)
RDW: 12.7 % (ref 11.0–15.0)
Total Lymphocyte: 36.6 %
WBC: 7.1 10*3/uL (ref 3.8–10.8)

## 2021-11-14 LAB — COMPLETE METABOLIC PANEL WITH GFR
AG Ratio: 1.8 (calc) (ref 1.0–2.5)
ALT: 15 U/L (ref 6–29)
AST: 22 U/L (ref 10–30)
Albumin: 5 g/dL (ref 3.6–5.1)
Alkaline phosphatase (APISO): 56 U/L (ref 31–125)
BUN: 12 mg/dL (ref 7–25)
CO2: 29 mmol/L (ref 20–32)
Calcium: 10.1 mg/dL (ref 8.6–10.2)
Chloride: 101 mmol/L (ref 98–110)
Creat: 0.82 mg/dL (ref 0.50–0.96)
Globulin: 2.8 g/dL (calc) (ref 1.9–3.7)
Glucose, Bld: 80 mg/dL (ref 65–99)
Potassium: 3.7 mmol/L (ref 3.5–5.3)
Sodium: 140 mmol/L (ref 135–146)
Total Bilirubin: 0.4 mg/dL (ref 0.2–1.2)
Total Protein: 7.8 g/dL (ref 6.1–8.1)
eGFR: 100 mL/min/{1.73_m2} (ref >=60)

## 2021-11-14 LAB — VITAMIN D 25 HYDROXY (VIT D DEFICIENCY, FRACTURES): Vit D, 25-Hydroxy: 32 ng/mL (ref 30–100)

## 2021-11-14 LAB — TSH: TSH: 1.97 mIU/L

## 2021-11-14 LAB — VITAMIN B12: Vitamin B-12: 273 pg/mL (ref 200–1100)

## 2021-11-25 ENCOUNTER — Other Ambulatory Visit: Payer: Self-pay | Admitting: Internal Medicine

## 2021-11-25 DIAGNOSIS — F988 Other specified behavioral and emotional disorders with onset usually occurring in childhood and adolescence: Secondary | ICD-10-CM

## 2021-11-25 MED ORDER — AMPHETAMINE-DEXTROAMPHETAMINE 20 MG PO TABS: ORAL_TABLET | ORAL | 0 refills | Status: DC

## 2021-12-02 ENCOUNTER — Ambulatory Visit
Admission: RE | Admit: 2021-12-02 | Discharge: 2021-12-02 | Disposition: A | Discharge status: Home / Self Care | Payer: BC Managed Care – PPO | Source: Ambulatory Visit | Attending: Neuroradiology | Admitting: Neuroradiology

## 2021-12-02 ENCOUNTER — Ambulatory Visit (HOSPITAL_COMMUNITY): Payer: BC Managed Care – PPO

## 2021-12-02 DIAGNOSIS — I720 Aneurysm of carotid artery: Secondary | ICD-10-CM | POA: Diagnosis not present

## 2021-12-02 DIAGNOSIS — I729 Aneurysm of unspecified site: Secondary | ICD-10-CM

## 2021-12-04 ENCOUNTER — Telehealth (HOSPITAL_COMMUNITY): Payer: Self-pay

## 2021-12-04 NOTE — Telephone Encounter (Signed)
Pt agreed to f/u in 3 years with an MRA per Dr. Debbrah Alar. AW

## 2022-01-01 DIAGNOSIS — R300 Dysuria: Secondary | ICD-10-CM | POA: Diagnosis not present

## 2022-01-01 DIAGNOSIS — Z6821 Body mass index (BMI) 21.0-21.9, adult: Secondary | ICD-10-CM | POA: Diagnosis not present

## 2022-01-01 DIAGNOSIS — N3001 Acute cystitis with hematuria: Secondary | ICD-10-CM | POA: Diagnosis not present

## 2022-01-11 ENCOUNTER — Other Ambulatory Visit: Payer: Self-pay | Admitting: Nurse Practitioner

## 2022-01-11 DIAGNOSIS — F988 Other specified behavioral and emotional disorders with onset usually occurring in childhood and adolescence: Secondary | ICD-10-CM

## 2022-01-11 DIAGNOSIS — F411 Generalized anxiety disorder: Secondary | ICD-10-CM

## 2022-01-12 MED ORDER — AMPHETAMINE-DEXTROAMPHETAMINE 20 MG PO TABS: ORAL_TABLET | ORAL | 0 refills | Status: DC

## 2022-01-12 MED ORDER — ALPRAZOLAM 0.5 MG PO TABS: ORAL_TABLET | ORAL | 0 refills | Status: DC

## 2022-01-13 ENCOUNTER — Encounter: Payer: BC Managed Care – PPO | Admitting: Nurse Practitioner

## 2022-02-24 ENCOUNTER — Other Ambulatory Visit: Payer: Self-pay | Admitting: Nurse Practitioner

## 2022-02-24 DIAGNOSIS — F988 Other specified behavioral and emotional disorders with onset usually occurring in childhood and adolescence: Secondary | ICD-10-CM

## 2022-02-24 DIAGNOSIS — F411 Generalized anxiety disorder: Secondary | ICD-10-CM

## 2022-02-25 MED ORDER — AMPHETAMINE-DEXTROAMPHETAMINE 20 MG PO TABS: ORAL_TABLET | ORAL | 0 refills | Status: DC

## 2022-02-25 MED ORDER — ALPRAZOLAM 0.5 MG PO TABS: ORAL_TABLET | ORAL | 0 refills | Status: DC

## 2022-04-08 ENCOUNTER — Encounter: Payer: Self-pay | Admitting: Nurse Practitioner

## 2022-04-08 DIAGNOSIS — F988 Other specified behavioral and emotional disorders with onset usually occurring in childhood and adolescence: Secondary | ICD-10-CM

## 2022-04-08 DIAGNOSIS — F411 Generalized anxiety disorder: Secondary | ICD-10-CM

## 2022-04-08 MED ORDER — AMPHETAMINE-DEXTROAMPHETAMINE 20 MG PO TABS: ORAL_TABLET | ORAL | 0 refills | Status: DC

## 2022-04-08 MED ORDER — CYCLOBENZAPRINE HCL 10 MG PO TABS: ORAL_TABLET | ORAL | 0 refills | Status: DC

## 2022-04-08 MED ORDER — ALPRAZOLAM 0.5 MG PO TABS: ORAL_TABLET | ORAL | 0 refills | Status: DC

## 2022-04-08 MED ORDER — LAMOTRIGINE 100 MG PO TABS: ORAL_TABLET | ORAL | 1 refills | Status: DC

## 2022-05-05 ENCOUNTER — Ambulatory Visit (INDEPENDENT_AMBULATORY_CARE_PROVIDER_SITE_OTHER): Payer: BC Managed Care – PPO | Admitting: Nurse Practitioner

## 2022-05-05 ENCOUNTER — Encounter: Payer: Self-pay | Admitting: Nurse Practitioner

## 2022-05-05 VITALS — BP 114/76 | HR 124 | Temp 97.7°F | Ht 63.0 in | Wt 123.4 lb

## 2022-05-05 DIAGNOSIS — Z131 Encounter for screening for diabetes mellitus: Secondary | ICD-10-CM

## 2022-05-05 DIAGNOSIS — F319 Bipolar disorder, unspecified: Secondary | ICD-10-CM

## 2022-05-05 DIAGNOSIS — E559 Vitamin D deficiency, unspecified: Secondary | ICD-10-CM

## 2022-05-05 DIAGNOSIS — Z1389 Encounter for screening for other disorder: Secondary | ICD-10-CM | POA: Diagnosis not present

## 2022-05-05 DIAGNOSIS — E785 Hyperlipidemia, unspecified: Secondary | ICD-10-CM

## 2022-05-05 DIAGNOSIS — F988 Other specified behavioral and emotional disorders with onset usually occurring in childhood and adolescence: Secondary | ICD-10-CM

## 2022-05-05 DIAGNOSIS — Z0000 Encounter for general adult medical examination without abnormal findings: Secondary | ICD-10-CM

## 2022-05-05 DIAGNOSIS — Z7251 High risk heterosexual behavior: Secondary | ICD-10-CM

## 2022-05-05 DIAGNOSIS — Z130 Encounter for screening for diseases of the blood and blood-forming organs and certain disorders involving the immune mechanism: Secondary | ICD-10-CM | POA: Diagnosis not present

## 2022-05-05 DIAGNOSIS — F409 Phobic anxiety disorder, unspecified: Secondary | ICD-10-CM

## 2022-05-05 DIAGNOSIS — K589 Irritable bowel syndrome without diarrhea: Secondary | ICD-10-CM

## 2022-05-05 DIAGNOSIS — Z1329 Encounter for screening for other suspected endocrine disorder: Secondary | ICD-10-CM

## 2022-05-05 DIAGNOSIS — E538 Deficiency of other specified B group vitamins: Secondary | ICD-10-CM

## 2022-05-05 DIAGNOSIS — S069X0S Unspecified intracranial injury without loss of consciousness, sequela: Secondary | ICD-10-CM

## 2022-05-05 DIAGNOSIS — S069XAS Unspecified intracranial injury with loss of consciousness status unknown, sequela: Secondary | ICD-10-CM

## 2022-05-05 DIAGNOSIS — Z13220 Encounter for screening for lipoid disorders: Secondary | ICD-10-CM | POA: Diagnosis not present

## 2022-05-05 DIAGNOSIS — Z8619 Personal history of other infectious and parasitic diseases: Secondary | ICD-10-CM

## 2022-05-05 DIAGNOSIS — Z79899 Other long term (current) drug therapy: Secondary | ICD-10-CM

## 2022-05-05 DIAGNOSIS — S0300XS Dislocation of jaw, unspecified side, sequela: Secondary | ICD-10-CM

## 2022-05-05 DIAGNOSIS — J4520 Mild intermittent asthma, uncomplicated: Secondary | ICD-10-CM

## 2022-05-05 DIAGNOSIS — R519 Headache, unspecified: Secondary | ICD-10-CM

## 2022-05-05 DIAGNOSIS — I671 Cerebral aneurysm, nonruptured: Secondary | ICD-10-CM

## 2022-05-05 DIAGNOSIS — F411 Generalized anxiety disorder: Secondary | ICD-10-CM

## 2022-05-05 DIAGNOSIS — Z0001 Encounter for general adult medical examination with abnormal findings: Secondary | ICD-10-CM

## 2022-05-05 DIAGNOSIS — G44209 Tension-type headache, unspecified, not intractable: Secondary | ICD-10-CM

## 2022-05-05 MED ORDER — LISDEXAMFETAMINE DIMESYLATE 40 MG PO CAPS: 40.0000 mg | ORAL_CAPSULE | ORAL | 0 refills | Status: DC

## 2022-05-05 MED ORDER — PROPRANOLOL HCL 10 MG PO TABS: 10.0000 mg | ORAL_TABLET | Freq: Two times a day (BID) | ORAL | 11 refills | Status: DC | PRN

## 2022-05-05 NOTE — Progress Notes (Signed)
Complete Physical  Assessment and Plan:  Hannah Reeves was seen today for annual exam.  Diagnoses and all orders for this visit:  Encounter for routine adult health examination without abnormal findings Health Maintenance- Discussed STD testing, safe sex, alcohol and drug awareness, drinking and driving dangers, wearing a seat belt and general safety measures for young adult.  Left cavernous carotid aneurysm Following with Dr. Blair Dolphin Rodriques   TMJ/ tension headaches Frequent headaches TMJ MRI  Heat to neck, massage, gentle stretching Avoid hard foods Stress management techniques discussed, increase water, good sleep hygiene discussed, increase exercise, and increase veggies.   Mild intermittent asthma without complication No recent flares; monitor; avoid triggers  Irritable bowel syndrome, unspecified type No recent flare. Suggest daily probiotic Monitor stress  Insomnia due to anxiety and fear Managing with benadryl/xanax PRN Stress reduction and good sleep hygiene discussed, increase day time activity, try melatonin or benadryl if this does not help we will call in sleep medication.   H/O cold sores Continue PRN antiviral   Bipolar depression ([place]) Continue Lamictal and Lexapro Encouraged follow up with counselor; sig work related stress Possibly r/t hx of TBI; monitor closely  Continue to monitor  Anxiety, generalized Reviewed relaxation techniques.  Sleep hygiene. Recommended Cognitive Behavioral Therapy (CBT). Recommended mindfulness meditation and exercise.   Insight-oriented psychotherapy given for 16 minutes exclusively. Psychoeducation:  encouraged personality growth wand development through coping techniques and problem-solving skills. Limit/Decrease/Monitor drug/alcohol intake.     Attention deficit disorder (ADD) without hyperactivity Continue medications Medications providing over 30% improvement of attention deficit hyperactivity disorder. No  change in management. Using lowest effective dose. Discussed alternative pharmacological and non-pharmacological therapies. No suspected aberrant drug-taking behaviors. Continue to monitor  Cognitive deficit as late effect of traumatic brain injury [facility]) Post injury in 2016; monitor   Hyperlipidemia, unspecified hyperlipidemia type Discussed lifestyle modifications. Recommended diet heavy in fruits and veggies, omega 3's. Decrease consumption of animal meats, cheeses, and dairy products. Remain active and exercise as tolerated. Continue to monitor. Check lipids/TSH    Screening for thyroid disorder -     TSH  Medication management All medications discussed and reviewed in full. All questions and concerns regarding medications addressed.    B12 deficiency -     Vitamin B12  Vitamin D deficiency -     VITAMIN D 25 Hydroxy (Vit-D Deficiency, Fractures)  High Risk Sexual Behavior STD Screening Discussed safe sex practices.  Orders Placed This Encounter  Procedures   C. trachomatis/N. gonorrhoeae RNA   CBC with Differential/Platelet   COMPLETE METABOLIC PANEL WITH GFR   Lipid panel   TSH   Hemoglobin A1c   Insulin, random   VITAMIN D 25 Hydroxy (Vit-D Deficiency, Fractures)   Urinalysis, Routine w reflex microscopic   Vitamin B12   RPR   HIV Antibody (routine testing w rflx)   HSV(herpes simplex vrs) 1+2 ab-IgG     Notify office for further evaluation and treatment, questions or concerns if any reported s/s fail to improve.   The patient was advised to call back or seek an in-person evaluation if any symptoms worsen or if the condition fails to improve as anticipated.   Further disposition pending results of labs. Discussed med's effects and SE's.    I discussed the assessment and treatment plan with the patient. The patient was provided an opportunity to ask questions and all were answered. The patient agreed with the plan and demonstrated an understanding of  the instructions.  Discussed med's effects  and SE's. Screening labs and tests as requested with regular follow-up as recommended.  I provided 35 minutes of face-to-face time during this encounter including counseling, chart review, and critical decision making was preformed.  Today's Plan of Care is based on a patient-centered health care approach known as shared decision making - the decisions, tests and treatments allow for patient preferences and values to be balanced with clinical evidence.    Future Appointments  Date Time Provider [place]  05/05/2023  2:00 PM Darrol Jump, NP [facility] None      HPI  27 y.o. female  presents for a complete physical and follow up for has Asthma; IBS (irritable bowel syndrome); H/O cold sores; ADD (attention deficit disorder); Bipolar depression ([place]); Migraine without aura; Anxiety, generalized; Insomnia due to anxiety and fear; Cognitive deficit as late effect of traumatic brain injury ([place]); Vitamin D deficiency; Body mass index (BMI) of 19.0-19.9 in adult; Family history of cerebral aneurysm; Left cavernous carotid aneurysm; B12 deficiency; TMJ dislocation; Tension headache; and Myofascial neck pain on their problem list.   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   She has a new boyfriend for the last few months, good relationship, receptive to STD screening.  She has the copper IUD and uses condoms.  She follows with GYN Dr. Josefa Half at [facility] care, goes annually. Reports UTD with Pap.  She does report work in stressful, started 03/2022.  Not a stressed, but continues to have days. She continues to have increase in anxiety, sometimes panic.  No longer taking Lexapro.  Does take Xanax PRN.  She was initiated in 2020 on lamotrigine due to labile mood and depressive episodes with suspicion for type 2  bipolar disorder. Taking 200 mg daily with perceived benefit, improved mood, less lability, fewer outbursts. Stopped trazodone/buspar, was declining SSRI,  Was working with therapist but wasn't good fit, has been too busy to find a new one. Does not feel as though it truly helps.    Reports HA, tension/TMJ flare is manageable.  Father and PGF had aneurysms; patient had MRA in 09/2020 that showed small 2 x 3 mm aneurysm of the proximal left cavernous carotid. She is now following with Dr. Blair Dolphin Rodrique.  She is on adderall for ADD for many years; takes as needed for work.  Feels as though the 20 mg is no longer effective.  Wanting to switch to Vyvanse, she was on this in college.   She is aware this may make anxiety worse, but feels strongly she needs for mood and focus. Hasn't noted worse anxiety on days she takes.   BMI is Body mass index is 21.86 kg/m.,  She is working on diet and exercise. Wt Readings from Last 3 Encounters:  05/05/22 123 lb 6.4 oz (56 kg)  11/13/21 125 lb (56.7 kg)  05/02/21 115 lb (52.2 kg)   Today their BP is BP: 114/76 She does workout. She denies chest pain, shortness of breath. She does have episodes of dizziness. Admits intermittently poor fluid intake, hasn't had much today.   She is not on cholesterol medication and denies myalgias. Her cholesterol is not at goal. The cholesterol last visit was:   Lab Results  Component Value Date   CHOL 178 09/18/2019   HDL 67 09/18/2019   LDLCALC 97 09/18/2019   TRIG 58 09/18/2019   CHOLHDL 2.7  09/18/2019   She has been working on diet and exercise for glucose management. Last A1C in the office was:  Lab Results  Component Value Date   HGBA1C 5.2 06/28/2013   Last GFR: Lab Results  Component Value Date   GFRNONAA >60 02/22/2020   Patient is currently on Vitamin D supplement, takes unkonwn dose irregularly.  Lab Results  Component Value Date   VD25OH 32 11/13/2021     She is not taking supplement   Lab Results  Component Value Date   [id number] 11/13/2021      Current Medications:  Current Outpatient Medications on File Prior to Visit  Medication Sig Dispense Refill   acetaminophen (TYLENOL) 325 MG tablet Take 650 mg by mouth every 6 (six) hours as needed (pain).     ALPRAZolam (XANAX) 0.5 MG tablet TAKE 1/2 TO 1 TABLET 2 - 3 TIMES DAILY ONLY IF NEEDED FOR SEVERE ANXIETY/PANIC *MAX 5 DAYS PER WEEK* 30 tablet 0   amphetamine-dextroamphetamine (ADDERALL) 20 MG tablet Take 1/2 - 1 tablet 2 x /day as needed for ADD, Focus & Concentration &  limit to 5 days /week to avoid Addiction 60 tablet 0   cyclobenzaprine (FLEXERIL) 10 MG tablet Take  1/2 to 1 tablet   2-3 x /day  as needed for Muscle Spasms 30 tablet 0   ibuprofen (ADVIL) 600 MG tablet Take 1 tablet (600 mg total) by mouth every 8 (eight) hours as needed. 60 tablet 1   lamoTRIgine (LAMICTAL) 100 MG tablet TAKE 1 TABLET BY MOUTH TWICE A DAY FOR MOOD 180 tablet 1   Multiple Vitamin (MULTIVITAMIN ADULT PO) Take by mouth daily.     cetirizine (ZYRTEC) 10 MG tablet Take 10 mg by mouth as needed for allergies. (Patient not taking: Reported on 05/02/2021)     escitalopram (LEXAPRO) 5 MG tablet Take 1 tablet (5 mg total) by mouth daily. (Patient not taking: Reported on 05/02/2021) 90 tablet 0   No current facility-administered medications on file prior to visit.   Allergies:  Allergies  Allergen Reactions   Peanut-Containing Drug Products Shortness Of Breath and Itching    TREE NUTS - ITCHING OF MOUTH    Shellfish Allergy Shortness Of Breath and Itching    ITCHING OF MOUTH   Penicillins Hives   Sulfa Antibiotics Hives   Medical History:  She has Asthma; IBS (irritable bowel syndrome); H/O cold sores; ADD (attention deficit disorder); Bipolar depression ([place]); Migraine without aura; Anxiety, generalized; Insomnia due to anxiety and fear; Cognitive deficit as late effect of traumatic brain injury ([place]); Vitamin D deficiency; Body  mass index (BMI) of 19.0-19.9 in adult; Family history of cerebral aneurysm; Left cavernous carotid aneurysm; B12 deficiency; TMJ dislocation; Tension headache; and Myofascial neck pain on their problem list.  Health Maintenance:   Immunization History  Administered Date(s) Administered   HPV Quadrivalent 03/02/2010   Influenza,inj,Quad PF,6+ Mos 01/31/2018, 01/07/2021   PFIZER(Purple Top)SARS-COV-2 Vaccination 06/22/2019, 07/17/2019   PPD Test 07/29/2017    Tetanus: patient states has had  <10 years, declines; plan to get age 62 Flu vaccine: TODAY  HPV: 2012 Covid 19: 2/2, 2021, pfizer  LMP: No LMP recorded. (Menstrual status: IUD). LMP - 04/09/22 Pap: 07/2018 neg HPV by gyn Dr. Quincy Simmonds MGM: n/a  Colonoscopy: n/a EGD: n/a  Last Dental Exam: [facility], last 2023 Last Eye Exam: [place] [facility], glasses, last 2023  Patient Care Team: Hannah Pinto, MD as PCP - General (Internal Medicine) Michel Santee, MD as Consulting  Physician (Neurology) de Sindy Messing, Erven Colla, MD as Consulting Physician (Radiology)  Surgical History:  She has a past surgical history that includes Wisdom tooth extraction; Tonsillectomy (N/A, 11/11/2016); Intrauterine device (iud) insertion; and arthrocentensis (Left, 03/20/2021). Family History:  Herfamily history includes Allergies in her mother; Aneurysm in her paternal grandfather; Anuerysm in her father; Stroke in her paternal grandfather. Social History:  She reports that she has quit smoking. Her smoking use included cigarettes. She has never used smokeless tobacco. She reports current alcohol use of about 2.0 standard drinks of alcohol per week. She reports that she does not currently use drugs after having used the following drugs: Marijuana.  Review of Systems: Review of Systems  Constitutional:  Negative for malaise/fatigue and weight loss.  HENT:  Negative for hearing loss and tinnitus.   Eyes:  Negative for blurred vision and double  vision.  Respiratory:  Negative for cough, shortness of breath and wheezing.   Cardiovascular:  Negative for chest pain, palpitations, orthopnea, claudication and leg swelling.  Gastrointestinal:  Negative for abdominal pain, blood in stool, constipation, diarrhea, heartburn, melena, nausea and vomiting.  Genitourinary: Negative.   Musculoskeletal:  Negative for joint pain and myalgias.  Skin:  Negative for rash.  Neurological:  Positive for headaches (recently progressed to daily headache, tension type, TMJ). Negative for dizziness, tingling, sensory change and weakness.  Endo/Heme/Allergies:  Negative for polydipsia.       Always cold  Psychiatric/Behavioral:  Negative for depression, memory loss, substance abuse and suicidal ideas. The patient is nervous/anxious and has insomnia.   All other systems reviewed and are negative.   Physical Exam: Estimated body mass index is 21.86 kg/m as calculated from the following:   Height as of this encounter: '5\' 3"'$  (1.6 m).   Weight as of this encounter: 123 lb 6.4 oz (56 kg). BP 114/76   Pulse (!) 124   Temp 97.7 F (36.5 C)   Ht '5\' 3"'$  (1.6 m)   Wt 123 lb 6.4 oz (56 kg)   SpO2 99%   BMI 21.86 kg/m  General Appearance: Slim young caucasian female, well dressed, good hygiene in no apparent distress.  Eyes: PERRLA, EOMs, conjunctiva no swelling or erythema Sinuses: No Frontal/maxillary tenderness  ENT/Mouth: Ext aud canals clear, normal light reflex with TMs without erythema, bulging. LImited oral exam, pain with jaw opening in bil TMJ. No erythema, swelling, or exudate on visible post pharynx. Tonsils not visible. Hearing normal.  Neck: Supple, thyroid normal. No bruits  Respiratory: Respiratory effort normal, BS equal bilaterally without rales, rhonchi, wheezing or stridor.  Cardio: RRR without murmurs, rubs or gallops. Brisk peripheral pulses without edema.  Chest: symmetric, with normal excursions and percussion.  Breasts: defer to  GYN Abdomen: Soft, nontender, no guarding, rebound, hernias, masses, or organomegaly.  Lymphatics: Non tender without lymphadenopathy.  Genitourinary: defer to GYN Musculoskeletal: Full ROM all peripheral extremities,5/5 strength, and normal gait.  Skin: Warm, dry without rashes, lesions, ecchymosis. Neuro: Cranial nerves intact, reflexes equal bilaterally. Normal muscle tone, no cerebellar symptoms. Sensation intact.  Psych: Awake and oriented X 3, anxious affect, Insight and Judgment appropriate.   EKG: WNL in 2015, defer, follows cardiology.  Darrol Jump, NP 2:33 PM [facility]

## 2022-05-05 NOTE — Patient Instructions (Signed)

## 2022-05-06 LAB — COMPLETE METABOLIC PANEL WITH GFR
AG Ratio: 1.7 (calc) (ref 1.0–2.5)
ALT: 11 U/L (ref 6–29)
AST: 15 U/L (ref 10–30)
Albumin: 5 g/dL (ref 3.6–5.1)
Alkaline phosphatase (APISO): 52 U/L (ref 31–125)
BUN: 10 mg/dL (ref 7–25)
CO2: 25 mmol/L (ref 20–32)
Calcium: 10 mg/dL (ref 8.6–10.2)
Chloride: 104 mmol/L (ref 98–110)
Creat: 0.69 mg/dL (ref 0.50–0.96)
Globulin: 2.9 g/dL (calc) (ref 1.9–3.7)
Glucose, Bld: 93 mg/dL (ref 65–99)
Potassium: 4.2 mmol/L (ref 3.5–5.3)
Sodium: 141 mmol/L (ref 135–146)
Total Bilirubin: 0.3 mg/dL (ref 0.2–1.2)
Total Protein: 7.9 g/dL (ref 6.1–8.1)
eGFR: 121 mL/min/{1.73_m2} (ref >=60)

## 2022-05-06 LAB — URINALYSIS, ROUTINE W REFLEX MICROSCOPIC
Bilirubin Urine: NEGATIVE
Glucose, UA: NEGATIVE
Hgb urine dipstick: NEGATIVE
Ketones, ur: NEGATIVE
Leukocytes,Ua: NEGATIVE
Nitrite: NEGATIVE
Protein, ur: NEGATIVE
Specific Gravity, Urine: 1.024 (ref 1.001–1.035)
pH: 6.5 (ref 5.0–8.0)

## 2022-05-06 LAB — HSV(HERPES SIMPLEX VRS) I + II AB-IGG
HAV 1 IGG,TYPE SPECIFIC AB: 28.9 index — ABNORMAL HIGH
HSV 2 IGG,TYPE SPECIFIC AB: <0.9 index

## 2022-05-06 LAB — VITAMIN B12: Vitamin B-12: 450 pg/mL (ref 200–1100)

## 2022-05-06 LAB — CBC WITH DIFFERENTIAL/PLATELET
Absolute Monocytes: 606 cells/uL (ref 200–950)
Basophils Absolute: 8 cells/uL (ref 0–200)
Basophils Relative: 0.1 %
Eosinophils Absolute: 58 cells/uL (ref 15–500)
Eosinophils Relative: 0.7 %
HCT: 39.3 % (ref 35.0–45.0)
Hemoglobin: 13.4 g/dL (ref 11.7–15.5)
Lymphs Abs: 2606 cells/uL (ref 850–3900)
MCH: 30.5 pg (ref 27.0–33.0)
MCHC: 34.1 g/dL (ref 32.0–36.0)
MCV: 89.3 fL (ref 80.0–100.0)
MPV: 9.6 fL (ref 7.5–12.5)
Monocytes Relative: 7.3 %
Neutro Abs: 5022 cells/uL (ref 1500–7800)
Neutrophils Relative %: 60.5 %
Platelets: 458 10*3/uL — ABNORMAL HIGH (ref 140–400)
RBC: 4.4 10*6/uL (ref 3.80–5.10)
RDW: 12.1 % (ref 11.0–15.0)
Total Lymphocyte: 31.4 %
WBC: 8.3 10*3/uL (ref 3.8–10.8)

## 2022-05-06 LAB — LIPID PANEL
Cholesterol: 196 mg/dL (ref <200)
HDL: 74 mg/dL (ref >=50)
LDL Cholesterol (Calc): 108 mg/dL (calc) — ABNORMAL HIGH
Non-HDL Cholesterol (Calc): 122 mg/dL (calc) (ref <130)
Total CHOL/HDL Ratio: 2.6 (calc) (ref <5.0)
Triglycerides: 59 mg/dL (ref <150)

## 2022-05-06 LAB — C. TRACHOMATIS/N. GONORRHOEAE RNA
C. trachomatis RNA, TMA: NOT DETECTED
N. gonorrhoeae RNA, TMA: NOT DETECTED

## 2022-05-06 LAB — INSULIN, RANDOM: Insulin: 11.3 u[IU]/mL

## 2022-05-06 LAB — HEMOGLOBIN A1C
Hgb A1c MFr Bld: 4.9 % of total Hgb (ref <5.7)
Mean Plasma Glucose: 94 mg/dL
eAG (mmol/L): 5.2 mmol/L

## 2022-05-06 LAB — VITAMIN D 25 HYDROXY (VIT D DEFICIENCY, FRACTURES): Vit D, 25-Hydroxy: 35 ng/mL (ref 30–100)

## 2022-05-06 LAB — TSH: TSH: 1.67 mIU/L

## 2022-05-06 LAB — RPR: RPR Ser Ql: NONREACTIVE

## 2022-05-06 LAB — HIV ANTIBODY (ROUTINE TESTING W REFLEX): HIV 1&2 Ab, 4th Generation: NONREACTIVE

## 2022-05-18 ENCOUNTER — Encounter: Payer: BC Managed Care – PPO | Admitting: Nurse Practitioner

## 2022-06-23 ENCOUNTER — Encounter: Payer: Self-pay | Admitting: Nurse Practitioner

## 2022-06-24 MED ORDER — AMPHETAMINE-DEXTROAMPHETAMINE 30 MG PO TABS: 30.0000 mg | ORAL_TABLET | Freq: Every day | ORAL | 0 refills | Status: DC

## 2022-06-25 ENCOUNTER — Other Ambulatory Visit: Payer: Self-pay | Admitting: Nurse Practitioner

## 2022-06-25 DIAGNOSIS — F411 Generalized anxiety disorder: Secondary | ICD-10-CM

## 2022-06-25 MED ORDER — AMPHETAMINE-DEXTROAMPHETAMINE 30 MG PO TABS: 30.0000 mg | ORAL_TABLET | Freq: Two times a day (BID) | ORAL | 0 refills | Status: DC

## 2022-06-26 ENCOUNTER — Other Ambulatory Visit: Payer: Self-pay

## 2022-06-26 MED ORDER — LAMOTRIGINE 100 MG PO TABS: ORAL_TABLET | ORAL | 0 refills | Status: DC

## 2022-06-26 MED ORDER — ALPRAZOLAM 0.5 MG PO TABS
ORAL_TABLET | ORAL | 0 refills | Status: DC
Start: 2022-06-26 — End: 2022-08-08

## 2022-06-26 MED ORDER — CYCLOBENZAPRINE HCL 10 MG PO TABS: ORAL_TABLET | ORAL | 0 refills | Status: DC

## 2022-06-30 ENCOUNTER — Encounter: Payer: Self-pay | Admitting: Nurse Practitioner

## 2022-08-08 ENCOUNTER — Other Ambulatory Visit: Payer: Self-pay | Admitting: Nurse Practitioner

## 2022-08-08 DIAGNOSIS — F411 Generalized anxiety disorder: Secondary | ICD-10-CM

## 2022-08-10 MED ORDER — ALPRAZOLAM 0.5 MG PO TABS
ORAL_TABLET | ORAL | 0 refills | Status: DC
Start: 2022-08-10 — End: 2022-09-15

## 2022-08-10 MED ORDER — AMPHETAMINE-DEXTROAMPHETAMINE 30 MG PO TABS: 30.0000 mg | ORAL_TABLET | Freq: Two times a day (BID) | ORAL | 0 refills | Status: DC

## 2022-09-14 ENCOUNTER — Other Ambulatory Visit: Payer: Self-pay | Admitting: Nurse Practitioner

## 2022-09-14 DIAGNOSIS — F411 Generalized anxiety disorder: Secondary | ICD-10-CM

## 2022-09-15 ENCOUNTER — Encounter: Payer: Self-pay | Admitting: Nurse Practitioner

## 2022-09-15 ENCOUNTER — Other Ambulatory Visit: Payer: Self-pay | Admitting: Nurse Practitioner

## 2022-09-15 DIAGNOSIS — F411 Generalized anxiety disorder: Secondary | ICD-10-CM

## 2022-09-15 MED ORDER — ALPRAZOLAM 0.5 MG PO TABS
ORAL_TABLET | ORAL | 0 refills | Status: DC
Start: 2022-09-15 — End: 2022-09-16

## 2022-09-16 MED ORDER — ALPRAZOLAM 0.5 MG PO TABS
ORAL_TABLET | ORAL | 0 refills | Status: DC
Start: 2022-09-16 — End: 2022-10-26

## 2022-09-23 ENCOUNTER — Encounter: Payer: Self-pay | Admitting: Nurse Practitioner

## 2022-09-23 ENCOUNTER — Other Ambulatory Visit: Payer: Self-pay | Admitting: Nurse Practitioner

## 2022-09-23 ENCOUNTER — Telehealth: Payer: Self-pay | Admitting: Nurse Practitioner

## 2022-09-23 MED ORDER — CYCLOBENZAPRINE HCL 10 MG PO TABS: ORAL_TABLET | ORAL | 0 refills | Status: DC

## 2022-09-23 MED ORDER — AMPHETAMINE-DEXTROAMPHETAMINE 30 MG PO TABS: 30.0000 mg | ORAL_TABLET | Freq: Two times a day (BID) | ORAL | 0 refills | Status: DC

## 2022-09-23 NOTE — Telephone Encounter (Signed)
Requesting refill on Adderall. Pls send to [facility] [address], [place], on file.

## 2022-10-26 ENCOUNTER — Encounter: Payer: Self-pay | Admitting: Nurse Practitioner

## 2022-10-26 DIAGNOSIS — F411 Generalized anxiety disorder: Secondary | ICD-10-CM

## 2022-10-26 MED ORDER — CYCLOBENZAPRINE HCL 10 MG PO TABS: ORAL_TABLET | ORAL | 0 refills | Status: DC

## 2022-10-26 MED ORDER — ALPRAZOLAM 0.5 MG PO TABS
ORAL_TABLET | ORAL | 0 refills | Status: AC
Start: 2022-10-26 — End: ?

## 2022-10-26 MED ORDER — AMPHETAMINE-DEXTROAMPHETAMINE 30 MG PO TABS: 30.0000 mg | ORAL_TABLET | Freq: Two times a day (BID) | ORAL | 0 refills | Status: DC

## 2022-11-10 ENCOUNTER — Ambulatory Visit: Payer: 59 | Admitting: Radiology

## 2022-12-01 ENCOUNTER — Encounter: Payer: Self-pay | Admitting: Nurse Practitioner

## 2022-12-01 DIAGNOSIS — F411 Generalized anxiety disorder: Secondary | ICD-10-CM

## 2022-12-02 MED ORDER — ALPRAZOLAM 0.5 MG PO TABS
ORAL_TABLET | ORAL | 0 refills | Status: DC
Start: 2022-12-02 — End: 2023-01-18

## 2022-12-02 MED ORDER — AMPHETAMINE-DEXTROAMPHETAMINE 30 MG PO TABS: 30.0000 mg | ORAL_TABLET | Freq: Two times a day (BID) | ORAL | 0 refills | Status: DC

## 2022-12-30 DIAGNOSIS — Z01419 Encounter for gynecological examination (general) (routine) without abnormal findings: Secondary | ICD-10-CM | POA: Diagnosis not present

## 2022-12-30 DIAGNOSIS — R87810 Cervical high risk human papillomavirus (HPV) DNA test positive: Secondary | ICD-10-CM | POA: Diagnosis not present

## 2022-12-30 DIAGNOSIS — R87610 Atypical squamous cells of undetermined significance on cytologic smear of cervix (ASC-US): Secondary | ICD-10-CM | POA: Diagnosis not present

## 2022-12-30 DIAGNOSIS — Z124 Encounter for screening for malignant neoplasm of cervix: Secondary | ICD-10-CM | POA: Diagnosis not present

## 2022-12-30 DIAGNOSIS — Z6822 Body mass index (BMI) 22.0-22.9, adult: Secondary | ICD-10-CM | POA: Diagnosis not present

## 2022-12-30 DIAGNOSIS — Z1151 Encounter for screening for human papillomavirus (HPV): Secondary | ICD-10-CM | POA: Diagnosis not present

## 2022-12-30 DIAGNOSIS — Z113 Encounter for screening for infections with a predominantly sexual mode of transmission: Secondary | ICD-10-CM | POA: Diagnosis not present

## 2023-01-14 DIAGNOSIS — Z139 Encounter for screening, unspecified: Secondary | ICD-10-CM | POA: Diagnosis not present

## 2023-01-14 DIAGNOSIS — R87610 Atypical squamous cells of undetermined significance on cytologic smear of cervix (ASC-US): Secondary | ICD-10-CM | POA: Diagnosis not present

## 2023-01-14 DIAGNOSIS — R87810 Cervical high risk human papillomavirus (HPV) DNA test positive: Secondary | ICD-10-CM | POA: Diagnosis not present

## 2023-01-15 DIAGNOSIS — N72 Inflammatory disease of cervix uteri: Secondary | ICD-10-CM | POA: Diagnosis not present

## 2023-01-18 ENCOUNTER — Encounter: Payer: Self-pay | Admitting: Nurse Practitioner

## 2023-01-18 DIAGNOSIS — F411 Generalized anxiety disorder: Secondary | ICD-10-CM

## 2023-01-18 MED ORDER — CYCLOBENZAPRINE HCL 10 MG PO TABS: ORAL_TABLET | ORAL | 0 refills | Status: DC

## 2023-01-18 MED ORDER — AMPHETAMINE-DEXTROAMPHETAMINE 30 MG PO TABS: 30.0000 mg | ORAL_TABLET | Freq: Two times a day (BID) | ORAL | 0 refills | Status: DC

## 2023-01-18 MED ORDER — LAMOTRIGINE 100 MG PO TABS: ORAL_TABLET | ORAL | 0 refills | Status: DC

## 2023-01-18 MED ORDER — ALPRAZOLAM 0.5 MG PO TABS
ORAL_TABLET | ORAL | 0 refills | Status: DC
Start: 2023-01-18 — End: 2023-03-29

## 2023-02-18 ENCOUNTER — Encounter: Payer: Self-pay | Admitting: Nurse Practitioner

## 2023-02-18 MED ORDER — AMPHETAMINE-DEXTROAMPHETAMINE 30 MG PO TABS: 30.0000 mg | ORAL_TABLET | Freq: Two times a day (BID) | ORAL | 0 refills | Status: DC

## 2023-03-18 ENCOUNTER — Encounter: Payer: Self-pay | Admitting: Nurse Practitioner

## 2023-03-18 DIAGNOSIS — F411 Generalized anxiety disorder: Secondary | ICD-10-CM

## 2023-03-29 MED ORDER — ALPRAZOLAM 0.5 MG PO TABS: ORAL_TABLET | ORAL | 0 refills | Status: DC

## 2023-03-31 ENCOUNTER — Other Ambulatory Visit: Payer: Self-pay | Admitting: Nurse Practitioner

## 2023-03-31 MED ORDER — CYCLOBENZAPRINE HCL 10 MG PO TABS: ORAL_TABLET | ORAL | 0 refills | Status: DC

## 2023-03-31 MED ORDER — AMPHETAMINE-DEXTROAMPHETAMINE 30 MG PO TABS: 30.0000 mg | ORAL_TABLET | Freq: Two times a day (BID) | ORAL | 0 refills | Status: DC

## 2023-04-01 ENCOUNTER — Ambulatory Visit (HOSPITAL_COMMUNITY): Payer: BC Managed Care – PPO

## 2023-04-29 ENCOUNTER — Other Ambulatory Visit: Payer: Self-pay | Admitting: Nurse Practitioner

## 2023-04-29 DIAGNOSIS — F411 Generalized anxiety disorder: Secondary | ICD-10-CM

## 2023-04-30 MED ORDER — ALPRAZOLAM 0.5 MG PO TABS: ORAL_TABLET | ORAL | 0 refills | Status: DC

## 2023-04-30 MED ORDER — AMPHETAMINE-DEXTROAMPHETAMINE 30 MG PO TABS: 30.0000 mg | ORAL_TABLET | Freq: Two times a day (BID) | ORAL | 0 refills | Status: DC

## 2023-04-30 MED ORDER — CYCLOBENZAPRINE HCL 10 MG PO TABS
ORAL_TABLET | ORAL | 0 refills | Status: DC
Start: 2023-04-30 — End: 2023-07-19

## 2023-05-05 ENCOUNTER — Encounter: Payer: BC Managed Care – PPO | Admitting: Nurse Practitioner

## 2023-07-19 ENCOUNTER — Encounter: Payer: Self-pay | Admitting: Family Medicine

## 2023-07-19 ENCOUNTER — Ambulatory Visit: Admitting: Family Medicine

## 2023-07-19 VITALS — BP 120/84 | HR 91 | Temp 98.1°F | Ht 63.0 in | Wt 124.0 lb

## 2023-07-19 DIAGNOSIS — F411 Generalized anxiety disorder: Secondary | ICD-10-CM

## 2023-07-19 DIAGNOSIS — M542 Cervicalgia: Secondary | ICD-10-CM | POA: Diagnosis not present

## 2023-07-19 DIAGNOSIS — F909 Attention-deficit hyperactivity disorder, unspecified type: Secondary | ICD-10-CM

## 2023-07-19 DIAGNOSIS — I671 Cerebral aneurysm, nonruptured: Secondary | ICD-10-CM

## 2023-07-19 DIAGNOSIS — Z7689 Persons encountering health services in other specified circumstances: Secondary | ICD-10-CM

## 2023-07-19 DIAGNOSIS — F319 Bipolar disorder, unspecified: Secondary | ICD-10-CM | POA: Diagnosis not present

## 2023-07-19 DIAGNOSIS — F409 Phobic anxiety disorder, unspecified: Secondary | ICD-10-CM

## 2023-07-19 DIAGNOSIS — F5105 Insomnia due to other mental disorder: Secondary | ICD-10-CM | POA: Diagnosis not present

## 2023-07-19 MED ORDER — CYCLOBENZAPRINE HCL 10 MG PO TABS: ORAL_TABLET | ORAL | 0 refills | Status: DC

## 2023-07-19 MED ORDER — ALPRAZOLAM 0.5 MG PO TABS: ORAL_TABLET | ORAL | 0 refills | Status: DC

## 2023-07-19 MED ORDER — LAMOTRIGINE 100 MG PO TABS: 100.0000 mg | ORAL_TABLET | Freq: Two times a day (BID) | ORAL | 2 refills | Status: DC

## 2023-07-19 MED ORDER — AMPHETAMINE-DEXTROAMPHETAMINE 30 MG PO TABS: 30.0000 mg | ORAL_TABLET | Freq: Two times a day (BID) | ORAL | 0 refills | Status: DC

## 2023-07-19 NOTE — Assessment & Plan Note (Signed)
 Placed a referral to IR to establish care with a new provider for MR angio head without contrast to be completed. Based on chart review this is suppose to be completed on 11/2024.

## 2023-07-19 NOTE — Patient Instructions (Addendum)
-  It was nice to meet you and look forward to providing you care. -Placed a referral to Interventional Radiology, where Dr. Katycia De Macedo Rodriques was located, for scans for your aneurysm. Based on chart review, you are due for scan until 11/2024. However, I placed a referral to make sure you establish with a new provider.  -Recommend Tdap vaccine if you have an injury.  -Ordered urine drug screen for management of Adderall and Xanax . This will need to be periodically completed. Also, signed a controlled substance contract that will need to be renewed every year.  -If you needa formal referral to psychiatry or psychology within the [facility] area, please call or send a MyChart message. Will be happy to send a referral.  -Refilled medications.  -Follow up in 3 months for a physical exam.

## 2023-07-19 NOTE — Assessment & Plan Note (Signed)
 Uncontrolled with being off medications. Patient signed controlled substance contract that needs to be renewed yearly. Ordered UDS that will need to be periodically obtained. PDMP reviewed. Last refill on 04/30/2023. Refilled Adderall 30mg  BID.

## 2023-07-19 NOTE — Progress Notes (Signed)
 New Patient Office Visit  Subjective   Patient ID: Hannah Reeves, female    DOB: 1995/09/06  Age: 28 y.o. MRN: [id number]  CC:  Chief Complaint  Patient presents with   Establish Care    Medication refill    HPI Hannah Reeves presents to establish care with new provider.   Patients previous primary care provider was [facility] with Dr. Bobie Burton, NP.   Specialist: Hiawatha Lout with Dr. Horton Maas  IR with Dr. Katycia De Macedo Rodriques   Patient was previous under the care of Dr. Katycia De Macedo Rodriques for left cavernous carotid aneurysm. Last MR angio head without contrast was 12/02/2021. Last note on 12/04/2021 was patient agreed to follow up in 3 years with [facility] per Dr. Lesta Rater. Patient thinks this provider has left the practice.   Anxiety/Insomnia: Chronic. Patient is prescribed Alprazolam  1/2-1 tablet, 2-3 times daily only if needed for severe anxiety/panic attack. Last took about a week ago. Various use depending on what is going on. Since previous MD PCP passed away, patient has not had any refills since February.  Bipolar Depression: Chronic. Patient is prescribed Lamotrigine  100mg  BID. Effective. Not taking it as prescribed due to not having other medications and getting out of a mood to take medications. Patient is residing in [place] and would like psychiatry and psychologist at that location. She reports she has some names of providers she may would like to be referred too. She reports she has a lot going on.   ADHD: Chronic. Patient is prescribed Adderral 30mg  BID. Last took beginning of April. Effective when taking it. Not had refills since February with the passing of her previous MD PCP.   Myofascial pain syndrome: Intermittent. Patient is prescribed Cyclobenzaprine  10mg , 1/2 to 1 tablet, 2-3 times a day for muscle spasms.She reports she needs a refill on this medication.     Outpatient Encounter  Medications as of 07/19/2023  Medication Sig   Multiple Vitamin (MULTIVITAMIN ADULT PO) Take by mouth daily.   [DISCONTINUED] acetaminophen  (TYLENOL ) 325 MG tablet Take 650 mg by mouth every 6 (six) hours as needed (pain).   [DISCONTINUED] ALPRAZolam  (XANAX ) 0.5 MG tablet TAKE 1/2 TO 1 TABLET 2 - 3 TIMES DAILY ONLY IF NEEDED FOR SEVERE ANXIETY/PANIC *MAX 5 DAYS PER WEEK*   [DISCONTINUED] amphetamine -dextroamphetamine  (ADDERALL) 30 MG tablet Take 1 tablet by mouth 2 (two) times daily.   [DISCONTINUED] cyclobenzaprine  (FLEXERIL ) 10 MG tablet Take  1/2 to 1 tablet   2-3 x /day  as needed for Muscle Spasms   [DISCONTINUED] lamoTRIgine  (LAMICTAL ) 100 MG tablet TAKE 1 TABLET BY MOUTH TWICE A DAY FOR MOOD   ALPRAZolam  (XANAX ) 0.5 MG tablet TAKE 1/2 TO 1 TABLET 2 - 3 TIMES DAILY ONLY IF NEEDED FOR SEVERE ANXIETY/PANIC *MAX 5 DAYS PER WEEK*   amphetamine -dextroamphetamine  (ADDERALL) 30 MG tablet Take 1 tablet by mouth 2 (two) times daily.   cetirizine (ZYRTEC) 10 MG tablet Take 10 mg by mouth as needed for allergies.   cyclobenzaprine  (FLEXERIL ) 10 MG tablet Take  1/2 to 1 tablet   2-3 x /day  as needed for Muscle Spasms   lamoTRIgine  (LAMICTAL ) 100 MG tablet Take 1 tablet (100 mg total) by mouth 2 (two) times daily. TAKE 1 TABLET BY MOUTH TWICE A DAY FOR MOOD   [DISCONTINUED] escitalopram  (LEXAPRO ) 5 MG tablet Take 1 tablet (5 mg total) by mouth daily. (Patient not taking: Reported on 05/02/2021)   [DISCONTINUED]  ibuprofen  (ADVIL ) 600 MG tablet Take 1 tablet (600 mg total) by mouth every 8 (eight) hours as needed.   [DISCONTINUED] lisdexamfetamine (VYVANSE ) 40 MG capsule Take 1 capsule (40 mg total) by mouth every morning.   [DISCONTINUED] propranolol  (INDERAL ) 10 MG tablet Take 1 tablet (10 mg total) by mouth 2 (two) times daily as needed. Take 30 min to 1 hour before an anxiety provoking situation.   No facility-administered encounter medications on file as of 07/19/2023.    Past Medical History:   Diagnosis Date   ADHD    Aneurysm (HCC)    Anxiety    Depression    History of traumatic brain injury    IBS (irritable bowel syndrome)    no current med.   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   S/P tonsillectomy 11/11/2016   Seasonal allergies    TMJ (dislocation of temporomandibular joint)     Past Surgical History:  Procedure Laterality Date   arthrocentensis Left 03/20/2021   INTRAUTERINE DEVICE (IUD) INSERTION     02-27-15 paragard  insertion   TONSILLECTOMY N/A 11/11/2016   Procedure: TONSILLECTOMY;  Surgeon: Janita Mellow, MD;  Location: [facility];  Service: ENT;  Laterality: N/A;   WISDOM TOOTH EXTRACTION      Family History  Problem Relation Age of Onset   Allergies Mother    Anuerysm Father    Stroke Paternal Grandfather    Aneurysm Paternal Grandfather     Social History   Socioeconomic History   Marital status: Single    Spouse name: Not on file   Number of children: 0   Years of education: Not on file   Highest education level: Bachelor's degree (e.g., BA, AB, BS)  Occupational History   Not on file  Tobacco Use   Smoking status: Never   Smokeless tobacco: Never   Tobacco comments:    Former Production manager   Vaping status: Former  Substance and Sexual Activity   Alcohol use: Yes    Alcohol/week: 2.0 standard drinks of alcohol    Types: 2 Glasses of wine per week   Drug use: Not Currently    Types: Marijuana   Sexual activity: Yes    Partners: Male    Birth control/protection: I.U.D.  Other Topics Concern   Not on file  Social History Narrative   Not on file   Social Drivers of Health   Financial Resource Strain: Low Risk  (07/19/2023)   Overall Financial Resource Strain (CARDIA)    Difficulty of Paying Living Expenses: Not hard at all  Food Insecurity: No Food Insecurity (07/19/2023)   Hunger Vital Sign    Worried About Running Out of Food in the Last Year: Never true    Ran Out of Food in  the Last Year: Never true  Transportation Needs: No Transportation Needs (07/19/2023)   PRAPARE - Administrator, Civil Service (Medical): No    Lack of Transportation (Non-Medical): No  Physical Activity: Insufficiently Active (07/19/2023)   Exercise Vital Sign    Days of Exercise per Week: 5 days    Minutes of Exercise per Session: 20 min  Stress: Stress Concern Present (07/19/2023)   [employer] of Occupational Health - Occupational Stress Questionnaire    Feeling of Stress : To some extent  Social Connections: Moderately Isolated (07/19/2023)   Social Connection and Isolation Panel [NHANES]    Frequency of Communication with Friends and Family: More than three times a  week    Frequency of Social Gatherings with Friends and Family: Twice a week    Attends Religious Services: 1 to 4 times per year    Active Member of [employer] or Organizations: No    Attends Banker Meetings: Never    Marital Status: Never married  Intimate Partner Violence: Not At Risk (07/19/2023)   Humiliation, Afraid, Rape, and Kick questionnaire    Fear of Current or Ex-Partner: No    Emotionally Abused: No    Physically Abused: No    Sexually Abused: No    ROS See HPI above    Objective  BP 120/84   Pulse 91   Temp 98.1 F (36.7 C) (Oral)   Ht 5\' 3"  (1.6 m)   Wt 124 lb (56.2 kg)   LMP  (LMP Unknown)   SpO2 98%   BMI 21.97 kg/m   Physical Exam Vitals reviewed.  Constitutional:      General: She is not in acute distress.    Appearance: Normal appearance. She is not ill-appearing, toxic-appearing or diaphoretic.  HENT:     Head: Normocephalic and atraumatic.  Eyes:     General:        Right eye: No discharge.        Left eye: No discharge.     Conjunctiva/sclera: Conjunctivae normal.  Cardiovascular:     Rate and Rhythm: Normal rate and regular rhythm.     Heart sounds: Normal heart sounds. No murmur heard.    No friction rub. No gallop.  Pulmonary:     Effort:  Pulmonary effort is normal. No respiratory distress.     Breath sounds: Normal breath sounds.  Musculoskeletal:        General: Normal range of motion.  Skin:    General: Skin is warm and dry.  Neurological:     General: No focal deficit present.     Mental Status: She is alert and oriented to person, place, and time. Mental status is at baseline.  Psychiatric:        Mood and Affect: Mood is anxious.        Behavior: Behavior normal. Behavior is cooperative.        Thought Content: Thought content normal.        Judgment: Judgment normal.      Assessment & Plan:  Bipolar depression (HCC) Assessment & Plan: Uncontrolled. Patient scored 7 on GAD-7 and 15 on PHQ-9. Refilled Lamotrigine  100mg  BID. Informed patient during visit, if she needed a formal referral to psychiatry or psychology within the [facility] area, please call or send a MyChart message. Will be happy to send a referral.   Orders: -     lamoTRIgine ; Take 1 tablet (100 mg total) by mouth 2 (two) times daily. TAKE 1 TABLET BY MOUTH TWICE A DAY FOR MOOD  Dispense: 60 tablet; Refill: 2  Anxiety, generalized Assessment & Plan: Uncontrolled. Scored GAD-7 and 15 on PHQ-9. Patient signed controlled substance contract that will need to be completed yearly. Ordered UDS that will need to be completed periodically. PDMP reviewed. Last refill was on 04/30/2023. Refilled Alprazolam  as previously prescribed.   Orders: -     DRUG MONITOR, PANEL 1, SCREEN, URINE -     ALPRAZolam ; TAKE 1/2 TO 1 TABLET 2 - 3 TIMES DAILY ONLY IF NEEDED FOR SEVERE ANXIETY/PANIC *MAX 5 DAYS PER WEEK*  Dispense: 15 tablet; Refill: 0  Insomnia due to anxiety and fear -     ALPRAZolam ; TAKE  1/2 TO 1 TABLET 2 - 3 TIMES DAILY ONLY IF NEEDED FOR SEVERE ANXIETY/PANIC *MAX 5 DAYS PER WEEK*  Dispense: 15 tablet; Refill: 0  Attention deficit hyperactivity disorder (ADHD), unspecified ADHD type Assessment & Plan: Uncontrolled with being off medications. Patient signed  controlled substance contract that needs to be renewed yearly. Ordered UDS that will need to be periodically obtained. PDMP reviewed. Last refill on 04/30/2023. Refilled Adderall 30mg  BID.   Orders: -     DRUG MONITOR, PANEL 1, SCREEN, URINE -     Amphetamine -Dextroamphetamine ; Take 1 tablet by mouth 2 (two) times daily.  Dispense: 60 tablet; Refill: 0  Left cavernous carotid aneurysm Assessment & Plan: Placed a referral to IR to establish care with a new provider for MR angio head without contrast to be completed. Based on chart review this is suppose to be completed on 11/2024.  Orders: -     Ambulatory referral to Interventional Radiology  Myofascial neck pain Assessment & Plan: Refilled Cyclobenzaprine  as prescribed.   Orders: -     Cyclobenzaprine  HCl; Take  1/2 to 1 tablet   2-3 x /day  as needed for Muscle Spasms  Dispense: 30 tablet; Refill: 0  Encounter to establish care   1.Review health maintenance:  -Covid booster: Declines  -Tdap: Unknown, recommend if had injury  2. Did not obtain labs since patient reports she had labs drawn with GYN back in November 2024. Unsure of what labs were drawn.  Return in about 3 months (around 10/19/2023) for physical.   Ryanne Morand, NP

## 2023-07-19 NOTE — Assessment & Plan Note (Signed)
 Uncontrolled. Scored GAD-7 and 15 on PHQ-9. Patient signed controlled substance contract that will need to be completed yearly. Ordered UDS that will need to be completed periodically. PDMP reviewed. Last refill was on 04/30/2023. Refilled Alprazolam  as previously prescribed.

## 2023-07-19 NOTE — Assessment & Plan Note (Addendum)
 Uncontrolled. Patient scored 7 on GAD-7 and 15 on PHQ-9. Refilled Lamotrigine  100mg  BID. Informed patient during visit, if she needed a formal referral to psychiatry or psychology within the [facility] area, please call or send a MyChart message. Will be happy to send a referral.

## 2023-07-19 NOTE — Assessment & Plan Note (Signed)
 Refilled Cyclobenzaprine  as prescribed.

## 2023-07-20 LAB — DRUG MONITOR, PANEL 1, SCREEN, URINE
Amphetamines: POSITIVE ng/mL — AB (ref <500)
Barbiturates: NEGATIVE ng/mL (ref <300)
Benzodiazepines: NEGATIVE ng/mL (ref <100)
Cocaine Metabolite: NEGATIVE ng/mL (ref <150)
Creatinine: 173.6 mg/dL (ref >=20.0)
Marijuana Metabolite: POSITIVE ng/mL — AB (ref <20)
Methadone Metabolite: NEGATIVE ng/mL (ref <100)
Opiates: NEGATIVE ng/mL (ref <100)
Oxidant: NEGATIVE ug/mL (ref <200)
Oxycodone: NEGATIVE ng/mL (ref <100)
Phencyclidine: NEGATIVE ng/mL (ref <25)
pH: 5.5 (ref 4.5–9.0)

## 2023-07-20 LAB — DM TEMPLATE

## 2023-07-21 ENCOUNTER — Ambulatory Visit: Payer: Self-pay | Admitting: Family Medicine

## 2023-07-28 ENCOUNTER — Ambulatory Visit: Admitting: Family Medicine

## 2023-08-24 ENCOUNTER — Encounter: Payer: Self-pay | Admitting: Family Medicine

## 2023-08-24 ENCOUNTER — Other Ambulatory Visit: Payer: Self-pay | Admitting: Family Medicine

## 2023-08-24 DIAGNOSIS — F411 Generalized anxiety disorder: Secondary | ICD-10-CM

## 2023-08-24 DIAGNOSIS — M542 Cervicalgia: Secondary | ICD-10-CM

## 2023-08-24 DIAGNOSIS — F909 Attention-deficit hyperactivity disorder, unspecified type: Secondary | ICD-10-CM

## 2023-08-24 DIAGNOSIS — F5105 Insomnia due to other mental disorder: Secondary | ICD-10-CM

## 2023-08-24 MED ORDER — CYCLOBENZAPRINE HCL 10 MG PO TABS: ORAL_TABLET | ORAL | 0 refills | Status: DC

## 2023-08-24 MED ORDER — ALPRAZOLAM 0.5 MG PO TABS: ORAL_TABLET | ORAL | 0 refills | Status: DC

## 2023-08-24 MED ORDER — AMPHETAMINE-DEXTROAMPHETAMINE 30 MG PO TABS: 30.0000 mg | ORAL_TABLET | Freq: Two times a day (BID) | ORAL | 0 refills | Status: DC

## 2023-08-24 NOTE — Progress Notes (Signed)
 Patient message needing these medications refilled and send to another pharmacy. PDMP reviewed. UDS UTD and controlled substance contract signed within the last year.

## 2023-09-27 ENCOUNTER — Encounter: Payer: Self-pay | Admitting: Family Medicine

## 2023-09-27 DIAGNOSIS — F409 Phobic anxiety disorder, unspecified: Secondary | ICD-10-CM

## 2023-09-27 DIAGNOSIS — M542 Cervicalgia: Secondary | ICD-10-CM

## 2023-09-27 DIAGNOSIS — F909 Attention-deficit hyperactivity disorder, unspecified type: Secondary | ICD-10-CM

## 2023-09-27 DIAGNOSIS — F411 Generalized anxiety disorder: Secondary | ICD-10-CM

## 2023-09-28 MED ORDER — ALPRAZOLAM 0.5 MG PO TABS: ORAL_TABLET | ORAL | 0 refills | Status: DC

## 2023-09-28 MED ORDER — CYCLOBENZAPRINE HCL 10 MG PO TABS: ORAL_TABLET | ORAL | 0 refills | Status: DC

## 2023-09-28 MED ORDER — AMPHETAMINE-DEXTROAMPHETAMINE 30 MG PO TABS: 30.0000 mg | ORAL_TABLET | Freq: Two times a day (BID) | ORAL | 0 refills | Status: DC

## 2023-10-27 ENCOUNTER — Encounter: Payer: Self-pay | Admitting: Family Medicine

## 2023-10-28 IMAGING — MR MR [PERSON_NAME]
12 series · 16 of 16 positions shown · non-contrast
Comparison: CT 04/18/2018

CLINICAL DATA: Left TMJ pain

EXAM:
MRI OF TEMPOROMANDIBULAR JOINT WITHOUT CONTRAST
TECHNIQUE: Multiplanar, multisequence MR imaging of the temporomandibular joint
was performed following the standard protocol. No intravenous
contrast was administered.

[Series 7: T1 · axial · 4.0mm · 0.62mm/px · z∈[-17,+30]mm · 2 of 13 slices shown (1 of 2)]
[im 1/13]
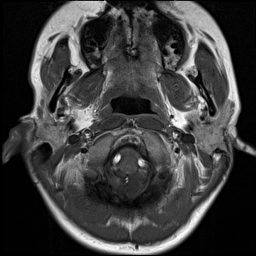
[im 13/13]
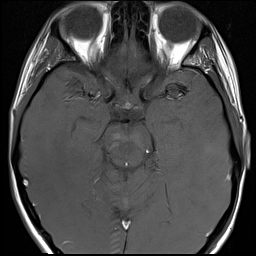

[Series 10: PD · oblique · 4.0mm · 0.44mm/px · 2 of 13 slices shown (1 of 8)]
[im 1/13]
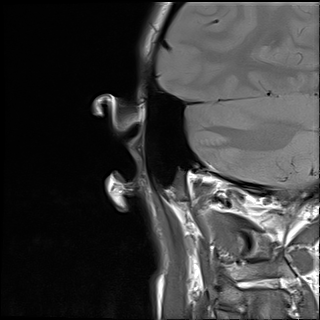
[im 13/13]
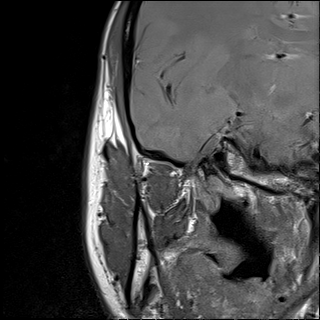

[Series 11: PD · coronal · 4.0mm · 0.44mm/px · 2 of 13 slices shown (2 of 8)]
[im 1/13]
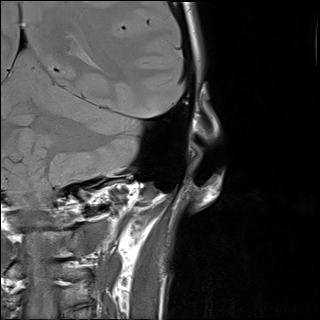
[im 13/13]
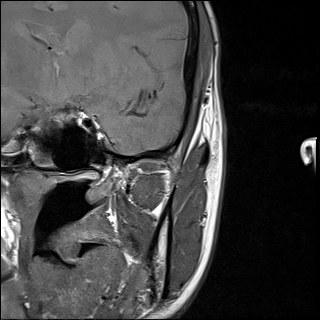

[Series 12: PD · sagittal · 4.0mm · 0.44mm/px · 2 of 11 slices shown (3 of 8)]
[im 1/11]
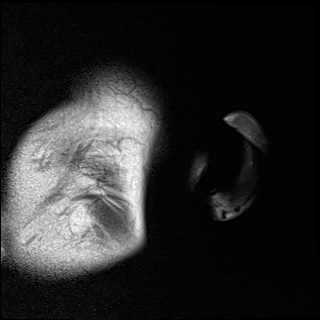
[im 11/11]
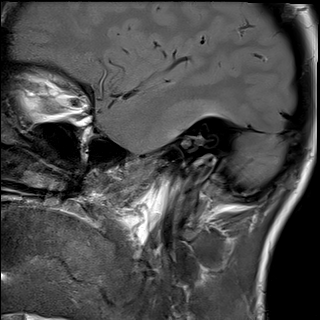

[Series 13: PD · sagittal · 4.0mm · 0.44mm/px · 1 of 9 slices shown (4 of 8)]
[im 1/9]
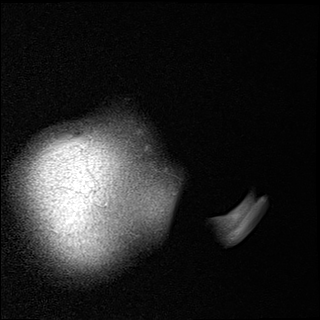

[Series 14: T2 fat-sat · sagittal · 4.0mm · 0.62mm/px · 1 of 11 slices shown (1 of 2)]
[im 1/11]
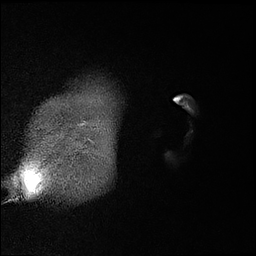

[Series 15: T2 fat-sat · sagittal · 4.0mm · 0.62mm/px · 1 of 11 slices shown (2 of 2)]
[im 1/11]
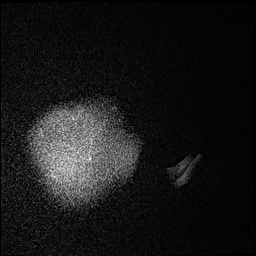

[Series 16: T1 · axial · 4.0mm · 0.31mm/px · 1 of 13 slices shown (2 of 2)]
[im 1/13]
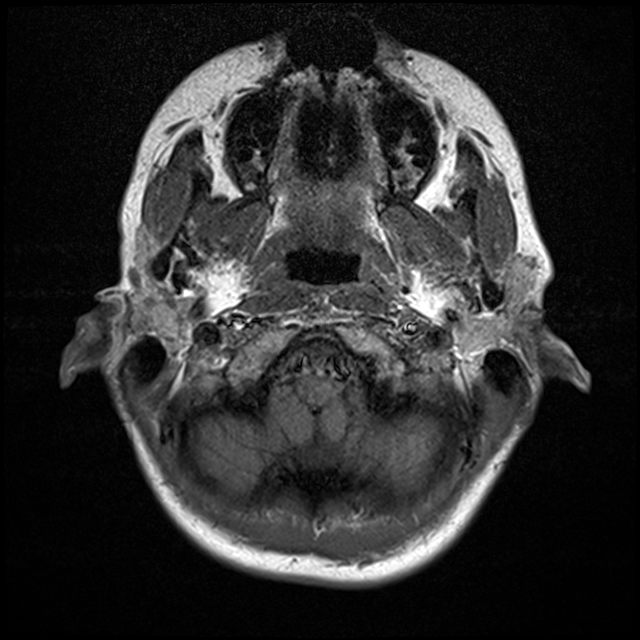

[Series 17: PD · sagittal · 4.0mm · 0.44mm/px · 1 of 11 slices shown (5 of 8)]
[im 1/11]
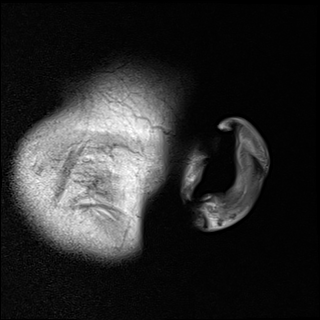

[Series 18: PD · sagittal · 4.0mm · 0.44mm/px · 1 of 11 slices shown (6 of 8)]
[im 1/11]
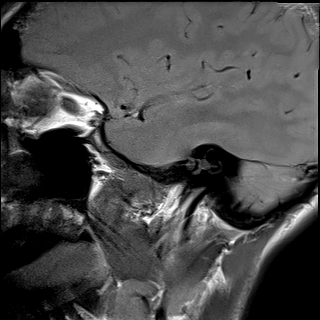

[Series 19: PD · coronal · 4.0mm · 0.44mm/px · 1 of 13 slices shown (7 of 8)]
[im 1/13]
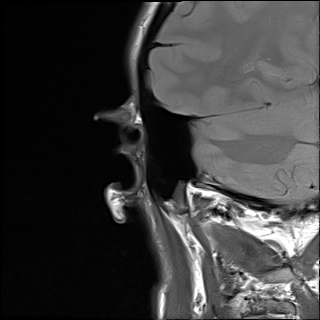

[Series 20: PD · coronal · 4.0mm · 0.44mm/px · 1 of 13 slices shown (8 of 8)]
[im 1/13]
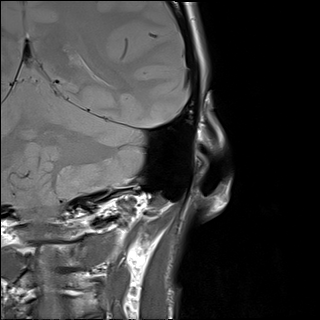

[16 of 16 positions shown; findings below may reference images not displayed]

FINDINGS: Right temporomandibular joint: The articular disc is anteriorly
subluxed relative to the mandibular condyle in the closed mouth
position.There is normal anterior translation of the mandibular
condyle with jaw opening. Upon mouth opening, there is recapture
with articular disc position between the mandibular condyle and
temporal bone. Articular disc is slightly thickened at its central
portion with intermediate intrasubstance signal. There is no joint
effusion. No erosive changes.

Left temporomandibular joint: The articular disc is slightly
anteriorly located relative to the mandibular condyle in closed
mouth position. There is minimal anterior translation of the
mandibular condyle with jaw opening. Disc remains slightly
anteriorly located on the open mouth position. Disc is thickened and
heterogeneous. There is no joint effusion. No erosive changes.

Other: None.
IMPRESSION: Bilateral temporomandibular joint dysfunction, left worse than
right.

## 2023-11-05 ENCOUNTER — Other Ambulatory Visit: Payer: Self-pay | Admitting: Family Medicine

## 2023-11-05 DIAGNOSIS — F909 Attention-deficit hyperactivity disorder, unspecified type: Secondary | ICD-10-CM

## 2023-11-05 MED ORDER — AMPHETAMINE-DEXTROAMPHETAMINE 30 MG PO TABS: 30.0000 mg | ORAL_TABLET | Freq: Two times a day (BID) | ORAL | 0 refills | Status: DC

## 2023-11-05 NOTE — Progress Notes (Signed)
 Patient is asking for a refill of medication. She has made an appointment for 09/15. Will obtain UDS at that visit and controlled substance contract is UTD. PDMP reviewed. Last refill was on 07/29.

## 2023-11-15 ENCOUNTER — Ambulatory Visit: Admitting: Family Medicine

## 2023-11-15 ENCOUNTER — Encounter: Payer: Self-pay | Admitting: Family Medicine

## 2023-11-15 VITALS — BP 108/64 | HR 99 | Temp 98.3°F | Ht 63.0 in | Wt 119.0 lb

## 2023-11-15 DIAGNOSIS — Z5181 Encounter for therapeutic drug level monitoring: Secondary | ICD-10-CM | POA: Diagnosis not present

## 2023-11-15 DIAGNOSIS — F411 Generalized anxiety disorder: Secondary | ICD-10-CM

## 2023-11-15 DIAGNOSIS — F5105 Insomnia due to other mental disorder: Secondary | ICD-10-CM

## 2023-11-15 DIAGNOSIS — F409 Phobic anxiety disorder, unspecified: Secondary | ICD-10-CM | POA: Diagnosis not present

## 2023-11-15 DIAGNOSIS — F319 Bipolar disorder, unspecified: Secondary | ICD-10-CM | POA: Diagnosis not present

## 2023-11-15 DIAGNOSIS — F909 Attention-deficit hyperactivity disorder, unspecified type: Secondary | ICD-10-CM

## 2023-11-15 MED ORDER — LAMOTRIGINE 100 MG PO TABS: 100.0000 mg | ORAL_TABLET | Freq: Two times a day (BID) | ORAL | 2 refills | Status: DC

## 2023-11-15 MED ORDER — ALPRAZOLAM 0.5 MG PO TABS: ORAL_TABLET | ORAL | 0 refills | Status: DC

## 2023-11-15 NOTE — Patient Instructions (Signed)
 It was nice to see you today! Today we: -Refilled prescription medications. -Discussed ADHD management and treatment plan. -Urine screen. Follow up in 3 months for physical and labs.

## 2023-11-15 NOTE — Progress Notes (Signed)
 Established Patient Office Visit   Subjective:  Patient ID: Hannah Reeves, female    DOB: 03/28/1995  Age: 28 y.o. MRN: [id number]  Chief Complaint  Patient presents with   Medical Management of Chronic Issues    3 month follow up   Hannah Reeves presents to the clinic for ADHD and Bipolar management. She reports doing fine overall on the current treatment regimen, but is wondering if the generic brand for Adderall is different from the name brand. This problem with the generic has been going on for years. The past months she reports the generic is not working and has worse side effects- headaches, insomnia, and muscle tension. She reports feeling more stressed lately. She prefers to not have the generic, however she can not afford the brand name.  Last Adderall filled on 11/08/23. Last Xanax  filled on 09/28/23.  HPI Review of Systems  Constitutional: Negative.   HENT: Negative.    Eyes: Negative.   Respiratory: Negative.    Cardiovascular: Negative.   Gastrointestinal: Negative.   Genitourinary: Negative.   Musculoskeletal: Negative.   Skin: Negative.   Neurological: Negative.   Psychiatric/Behavioral: Negative.      Objective:    BP 108/64   Pulse 99   Temp 98.3 F (36.8 C) (Oral)   Ht 5' 3 (1.6 m)   Wt 119 lb (54 kg)   SpO2 99%   BMI 21.08 kg/m    Physical Exam Constitutional:      Appearance: Normal appearance. She is normal weight.  HENT:     Head: Normocephalic.     Nose: Nose normal.  Cardiovascular:     Rate and Rhythm: Normal rate and regular rhythm.     Heart sounds: Normal heart sounds.  Pulmonary:     Effort: Pulmonary effort is normal.     Breath sounds: Normal breath sounds.  Skin:    General: Skin is warm and dry.  Neurological:     General: No focal deficit present.     Mental Status: She is alert and oriented to person, place, and time. Mental status is at baseline.  Psychiatric:        Mood and Affect: Mood normal.        Behavior:  Behavior normal.        Thought Content: Thought content normal.        Judgment: Judgment normal.     Assessment & Plan:  Attention deficit hyperactivity disorder (ADHD), unspecified ADHD type -     DRUG MONITOR, PANEL 1, SCREEN, URINE  Anxiety, generalized -     DRUG MONITOR, PANEL 1, SCREEN, URINE -     ALPRAZolam ; TAKE 1/2 TO 1 TABLET 2 - 3 TIMES DAILY ONLY IF NEEDED FOR SEVERE ANXIETY/PANIC *MAX 5 DAYS PER WEEK*  Dispense: 15 tablet; Refill: 0  Insomnia due to anxiety and fear -     ALPRAZolam ; TAKE 1/2 TO 1 TABLET 2 - 3 TIMES DAILY ONLY IF NEEDED FOR SEVERE ANXIETY/PANIC *MAX 5 DAYS PER WEEK*  Dispense: 15 tablet; Refill: 0  Bipolar depression (HCC) -     lamoTRIgine ; Take 1 tablet (100 mg total) by mouth 2 (two) times daily. TAKE 1 TABLET BY MOUTH TWICE A DAY FOR MOOD  Dispense: 60 tablet; Refill: 2    -PDMP reviewed for management of Alprazolam  and Amphetamine -Dextroamphetamine . Last refill for Alparazolm was 07/29 and Amphetamin-Dextroamphetamine  was 09/08. Controlled substance contract is UTD. Order UDS. Refilled Alprazolam .  -Refilled Lamotrigine  for Bipolar.  Follow up  in 3 months for physical and labs.  JoAnna Williamson, NP

## 2023-11-16 ENCOUNTER — Ambulatory Visit: Payer: Self-pay | Admitting: Family Medicine

## 2023-11-16 LAB — DRUG MONITOR, PANEL 1, SCREEN, URINE
Amphetamines: POSITIVE ng/mL — AB (ref <500)
Barbiturates: NEGATIVE ng/mL (ref <300)
Benzodiazepines: NEGATIVE ng/mL (ref <100)
Cocaine Metabolite: NEGATIVE ng/mL (ref <150)
Creatinine: 97.8 mg/dL (ref >=20.0)
Marijuana Metabolite: POSITIVE ng/mL — AB (ref <20)
Methadone Metabolite: NEGATIVE ng/mL (ref <100)
Opiates: NEGATIVE ng/mL (ref <100)
Oxidant: NEGATIVE ug/mL (ref <200)
Oxycodone: NEGATIVE ng/mL (ref <100)
Phencyclidine: NEGATIVE ng/mL (ref <25)
pH: 5.9 (ref 4.5–9.0)

## 2023-11-16 LAB — DM TEMPLATE

## 2023-12-13 ENCOUNTER — Other Ambulatory Visit (HOSPITAL_COMMUNITY): Payer: Self-pay | Admitting: Radiology

## 2023-12-13 DIAGNOSIS — Z8249 Family history of ischemic heart disease and other diseases of the circulatory system: Secondary | ICD-10-CM

## 2023-12-16 ENCOUNTER — Ambulatory Visit
Admission: RE | Admit: 2023-12-16 | Discharge: 2023-12-16 | Disposition: A | Discharge status: Home / Self Care | Payer: Self-pay | Source: Ambulatory Visit | Attending: Neuroradiology | Admitting: Neuroradiology

## 2023-12-16 ENCOUNTER — Ambulatory Visit: Admitting: Neuroradiology

## 2023-12-16 ENCOUNTER — Encounter: Payer: Self-pay | Admitting: Neuroradiology

## 2023-12-16 ENCOUNTER — Other Ambulatory Visit: Payer: Self-pay

## 2023-12-16 VITALS — BP 105/69 | HR 83 | Temp 98.1°F | Ht 63.75 in | Wt 115.8 lb

## 2023-12-16 DIAGNOSIS — G4489 Other headache syndrome: Secondary | ICD-10-CM | POA: Diagnosis not present

## 2023-12-16 DIAGNOSIS — I671 Cerebral aneurysm, nonruptured: Secondary | ICD-10-CM | POA: Diagnosis not present

## 2023-12-16 DIAGNOSIS — Z87820 Personal history of traumatic brain injury: Secondary | ICD-10-CM | POA: Diagnosis not present

## 2023-12-16 DIAGNOSIS — Z049 Encounter for examination and observation for unspecified reason: Secondary | ICD-10-CM
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
## 2023-12-17 ENCOUNTER — Encounter: Payer: Self-pay | Admitting: Family Medicine

## 2023-12-17 ENCOUNTER — Other Ambulatory Visit: Payer: Self-pay | Admitting: Family Medicine

## 2023-12-17 DIAGNOSIS — F319 Bipolar disorder, unspecified: Secondary | ICD-10-CM

## 2023-12-17 DIAGNOSIS — F5105 Insomnia due to other mental disorder: Secondary | ICD-10-CM

## 2023-12-17 DIAGNOSIS — M542 Cervicalgia: Secondary | ICD-10-CM

## 2023-12-17 DIAGNOSIS — F411 Generalized anxiety disorder: Secondary | ICD-10-CM

## 2023-12-17 DIAGNOSIS — F909 Attention-deficit hyperactivity disorder, unspecified type: Secondary | ICD-10-CM

## 2023-12-17 MED ORDER — AMPHETAMINE-DEXTROAMPHETAMINE 30 MG PO TABS: 30.0000 mg | ORAL_TABLET | Freq: Two times a day (BID) | ORAL | 0 refills | Status: DC

## 2023-12-17 MED ORDER — ALPRAZOLAM 0.5 MG PO TABS: ORAL_TABLET | ORAL | 0 refills | Status: DC

## 2023-12-17 MED ORDER — CYCLOBENZAPRINE HCL 10 MG PO TABS
ORAL_TABLET | ORAL | 1 refills | Status: DC
Start: 2023-12-17 — End: 2023-12-20

## 2023-12-17 MED ORDER — LAMOTRIGINE 100 MG PO TABS
100.0000 mg | ORAL_TABLET | Freq: Two times a day (BID) | ORAL | 2 refills | Status: DC
Start: 2023-12-17 — End: 2023-12-20

## 2023-12-17 NOTE — Progress Notes (Signed)
 Patient is requesting refills. PDMP reviewed for Adderall (last refill was on 09/08) and Alprazolam  (07/29). UDS and controlled substance agreement UTD.

## 2023-12-20 ENCOUNTER — Other Ambulatory Visit: Payer: Self-pay | Admitting: Family Medicine

## 2023-12-20 DIAGNOSIS — F411 Generalized anxiety disorder: Secondary | ICD-10-CM

## 2023-12-20 DIAGNOSIS — M542 Cervicalgia: Secondary | ICD-10-CM

## 2023-12-20 DIAGNOSIS — F909 Attention-deficit hyperactivity disorder, unspecified type: Secondary | ICD-10-CM

## 2023-12-20 DIAGNOSIS — F409 Phobic anxiety disorder, unspecified: Secondary | ICD-10-CM

## 2023-12-20 DIAGNOSIS — F319 Bipolar disorder, unspecified: Secondary | ICD-10-CM

## 2023-12-20 MED ORDER — ALPRAZOLAM 0.5 MG PO TABS: ORAL_TABLET | ORAL | 0 refills | Status: DC

## 2023-12-20 MED ORDER — CYCLOBENZAPRINE HCL 10 MG PO TABS: ORAL_TABLET | ORAL | 1 refills | Status: DC

## 2023-12-20 MED ORDER — AMPHETAMINE-DEXTROAMPHETAMINE 30 MG PO TABS: 30.0000 mg | ORAL_TABLET | Freq: Two times a day (BID) | ORAL | 0 refills | Status: DC

## 2023-12-20 MED ORDER — LAMOTRIGINE 100 MG PO TABS: 100.0000 mg | ORAL_TABLET | Freq: Two times a day (BID) | ORAL | 2 refills | Status: DC

## 2023-12-20 NOTE — Telephone Encounter (Signed)
 Medications have been canceled and sent to new pharmacy.

## 2023-12-20 NOTE — Progress Notes (Signed)
 Patient is requesting refills. PDMP reviewed for Adderall (last refill was on 09/08) and Alprazolam  (07/29). UDS and controlled substance agreement UTD.  Med[facility] However, [facility] states [facility] are not refilling Adderall if it is not prescribed by a psychiatrist.

## 2024-01-31 ENCOUNTER — Encounter: Payer: Self-pay | Admitting: Family Medicine

## 2024-01-31 ENCOUNTER — Telehealth: Payer: Self-pay | Admitting: Family Medicine

## 2024-01-31 ENCOUNTER — Ambulatory Visit: Payer: Self-pay | Admitting: Family Medicine

## 2024-01-31 DIAGNOSIS — F411 Generalized anxiety disorder: Secondary | ICD-10-CM

## 2024-01-31 DIAGNOSIS — M542 Cervicalgia: Secondary | ICD-10-CM

## 2024-01-31 DIAGNOSIS — F909 Attention-deficit hyperactivity disorder, unspecified type: Secondary | ICD-10-CM

## 2024-01-31 DIAGNOSIS — F409 Phobic anxiety disorder, unspecified: Secondary | ICD-10-CM

## 2024-01-31 DIAGNOSIS — F319 Bipolar disorder, unspecified: Secondary | ICD-10-CM

## 2024-01-31 NOTE — Telephone Encounter (Unsigned)
 Copied from CRM #[id number]. Topic: Clinical - Medication Refill >> Jan 31, 2024  2:21 PM Nessti S wrote: Medication: - Lamotrogine, adderall, cyclobenzaprine , alprazolam    Has the patient contacted their pharmacy? No (Agent: If no, request that the patient contact the pharmacy for the refill. If patient does not wish to contact the pharmacy document the reason why and proceed with request.) (Agent: If yes, when and what did the pharmacy advise?)  This is the patient's preferred pharmacy:  [facility] [id number] - [address] [place] - [address] [place] [address] Phone: [phone] Fax: [phone]  Is this the correct pharmacy for this prescription? Yes If no, delete pharmacy and type the correct one.   Has the prescription been filled recently? No  Is the patient out of the medication? Yes  Has the patient been seen for an appointment in the last year OR does the patient have an upcoming appointment? Yes  Can we respond through MyChart? Yes  Agent: Please be advised that Rx refills may take up to 3 business days. We ask that you follow-up with your pharmacy.

## 2024-01-31 NOTE — Telephone Encounter (Signed)
 FYI Only or Action Required?: Action required by provider: medication refill request.  Patient was last seen in primary care on 11/15/2023 by Billy Philippe SAUNDERS, NP.  Called Nurse Triage reporting Dizziness.  Symptoms began yesterday.  Interventions attempted: Nothing.  Symptoms are: gradually worsening.  Triage Disposition: Home Care, See PCP Within 2 Weeks  Patient/caregiver understands and will follow disposition?: Yes    Copied from CRM #[id number]. Topic: Clinical - Prescription Issue >> Jan 31, 2024  2:23 PM Nessti S wrote: Reason for CRM: pt said she thinks she is having side effects from not taking Lamotrogine. Call back number [phone] Reason for Disposition  Dizziness is a chronic symptom (recurrent or ongoing AND present > 4 weeks)  Answer Assessment - Initial Assessment Questions Pt states she has dizziness normally but is worried because she has been out of her medications could be making it worse. She takes lamotrigine  for a mood stabilizer but has been out of her all her medications for 2.5. Denies thought of self harm or others. She denies any higher acuity questions at this time. Does state she has been having increased dizziness last episode was yesterday where she felt like she was going to pass out (does have a hx of this) because it was worse one she had in a while she decided she needed to call to her her medications refilled.      1. DESCRIPTION: Describe your dizziness.     lightheaded 2. LIGHTHEADED: Do you feel lightheaded? (e.g., somewhat faint, woozy, weak upon standing)     Feels like she is going to pass out at times 3. VERTIGO: Do you feel like either you or the room is spinning or tilting? (i.e., vertigo)     no 4. SEVERITY: How bad is it?  Do you feel like you are going to faint? Can you stand and walk?     She can walk, at times feels like going to faint 5. ONSET:  When did the dizziness begin?     On going but worse  yesterday 6. AGGRAVATING FACTORS: Does anything make it worse? (e.g., standing, change in head position)     Hasn't taken meds in 2 weeks.  8. CAUSE: What do you think is causing the dizziness? (e.g., decreased fluids or food, diarrhea, emotional distress, heat exposure, new medicine, sudden standing, vomiting; unknown)     Thinks not taking her meds 9. RECURRENT SYMPTOM: Have you had dizziness before? If Yes, ask: When was the last time? What happened that time?     yes 10. OTHER SYMPTOMS: Do you have any other symptoms? (e.g., fever, chest pain, vomiting, diarrhea, bleeding)       denies  Protocols used: Dizziness - Lightheadedness-A-AH

## 2024-02-01 ENCOUNTER — Other Ambulatory Visit: Payer: Self-pay | Admitting: Family Medicine

## 2024-02-01 DIAGNOSIS — M542 Cervicalgia: Secondary | ICD-10-CM

## 2024-02-01 DIAGNOSIS — F909 Attention-deficit hyperactivity disorder, unspecified type: Secondary | ICD-10-CM

## 2024-02-01 DIAGNOSIS — F319 Bipolar disorder, unspecified: Secondary | ICD-10-CM

## 2024-02-01 DIAGNOSIS — F411 Generalized anxiety disorder: Secondary | ICD-10-CM

## 2024-02-01 DIAGNOSIS — F5105 Insomnia due to other mental disorder: Secondary | ICD-10-CM

## 2024-02-01 DIAGNOSIS — B009 Herpesviral infection, unspecified: Secondary | ICD-10-CM

## 2024-02-01 MED ORDER — ALPRAZOLAM 0.5 MG PO TABS: ORAL_TABLET | ORAL | 0 refills | Status: DC

## 2024-02-01 MED ORDER — LAMOTRIGINE 100 MG PO TABS: 100.0000 mg | ORAL_TABLET | Freq: Two times a day (BID) | ORAL | 2 refills | Status: DC

## 2024-02-01 MED ORDER — VALACYCLOVIR HCL 500 MG PO TABS: 500.0000 mg | ORAL_TABLET | Freq: Two times a day (BID) | ORAL | 2 refills | Status: AC

## 2024-02-01 MED ORDER — AMPHETAMINE-DEXTROAMPHETAMINE 30 MG PO TABS: 30.0000 mg | ORAL_TABLET | Freq: Two times a day (BID) | ORAL | 0 refills | Status: DC

## 2024-02-01 MED ORDER — CYCLOBENZAPRINE HCL 10 MG PO TABS: ORAL_TABLET | ORAL | 1 refills | Status: DC

## 2024-02-01 NOTE — Progress Notes (Signed)
 Patient is requesting several medications to be refilled.  PDMP reviewed. Last refill for Adderall was on 12/21/2023 and Alprazolam  09/28/2023.  Has an appointment scheduled for 12/08.  UDS and controlled substance contract UTD.

## 2024-02-01 NOTE — Telephone Encounter (Signed)
 Refilled earlier today.

## 2024-02-07 ENCOUNTER — Ambulatory Visit: Admitting: Family Medicine

## 2024-02-07 ENCOUNTER — Encounter: Payer: Self-pay | Admitting: Family Medicine

## 2024-02-07 VITALS — BP 100/60 | HR 100 | Temp 98.0°F | Ht 63.0 in | Wt 114.0 lb

## 2024-02-07 DIAGNOSIS — D696 Thrombocytopenia, unspecified: Secondary | ICD-10-CM

## 2024-02-07 DIAGNOSIS — E538 Deficiency of other specified B group vitamins: Secondary | ICD-10-CM

## 2024-02-07 DIAGNOSIS — F411 Generalized anxiety disorder: Secondary | ICD-10-CM

## 2024-02-07 DIAGNOSIS — F909 Attention-deficit hyperactivity disorder, unspecified type: Secondary | ICD-10-CM | POA: Diagnosis not present

## 2024-02-07 DIAGNOSIS — E559 Vitamin D deficiency, unspecified: Secondary | ICD-10-CM

## 2024-02-07 DIAGNOSIS — Z0000 Encounter for general adult medical examination without abnormal findings: Secondary | ICD-10-CM | POA: Diagnosis not present

## 2024-02-07 LAB — CBC WITH DIFFERENTIAL/PLATELET
Basophils Absolute: 0 K/uL (ref 0.0–0.1)
Basophils Relative: 0.3 % (ref 0.0–3.0)
Eosinophils Absolute: 0.1 K/uL (ref 0.0–0.7)
Eosinophils Relative: 1.6 % (ref 0.0–5.0)
HCT: 39.4 % (ref 36.0–46.0)
Hemoglobin: 13.7 g/dL (ref 12.0–15.0)
Lymphocytes Relative: 56.1 % — ABNORMAL HIGH (ref 12.0–46.0)
Lymphs Abs: 3.8 K/uL (ref 0.7–4.0)
MCHC: 34.7 g/dL (ref 30.0–36.0)
MCV: 88.8 fl (ref 78.0–100.0)
Monocytes Absolute: 0.6 K/uL (ref 0.1–1.0)
Monocytes Relative: 9.4 % (ref 3.0–12.0)
Neutro Abs: 2.2 K/uL (ref 1.4–7.7)
Neutrophils Relative %: 32.6 % — ABNORMAL LOW (ref 43.0–77.0)
Platelets: 424 K/uL — ABNORMAL HIGH (ref 150.0–400.0)
RBC: 4.44 Mil/uL (ref 3.87–5.11)
RDW: 13 % (ref 11.5–15.5)
WBC: 6.8 K/uL (ref 4.0–10.5)

## 2024-02-07 LAB — VITAMIN D 25 HYDROXY (VIT D DEFICIENCY, FRACTURES): VITD: 29.04 ng/mL — ABNORMAL LOW (ref 30.00–100.00)

## 2024-02-07 LAB — TSH: TSH: 4.14 u[IU]/mL (ref 0.35–5.50)

## 2024-02-07 LAB — VITAMIN B12: Vitamin B-12: 238 pg/mL (ref 211–911)

## 2024-02-07 NOTE — Patient Instructions (Signed)
-  It was great to see you this morning. -Physical exam completed today.  -Recommend to continue with a healthy diet and regular exercise.  -Continue all medications. -Ordered urine drug screen for management of Alprazolam  for anxiety and Adderall for ADHD.  -Ordered labs, except lipids due to not fasting. Office will call with lab results and will be available via MyChart.  -Follow up in 3 months or sooner with further concerns.

## 2024-02-07 NOTE — Progress Notes (Signed)
 Complete physical exam  Patient: Hannah Reeves   DOB: 16-Oct-1995   28 y.o. Female  MRN: [id number]  Subjective:    Chief Complaint  Patient presents with   Annual Exam    Hannah Reeves is a 28 y.o. female who presents today for a complete physical exam. She reports consuming a general with healthy options diet. The patient does not participate in regular exercise at present. She generally feels poorly. She reports sleeping poorly. She does not have additional problems to discuss today.    Most recent fall risk assessment:    02/07/2024    8:13 AM  Fall Risk   Falls in the past year? 0  Number falls in past yr: 0  Injury with Fall? 0  Risk for fall due to : No Fall Risks  Follow up Falls evaluation completed     Most recent depression screenings:    02/07/2024    8:13 AM 11/15/2023    8:08 AM  PHQ 2/9 Scores  PHQ - 2 Score 1 2  PHQ- 9 Score 4 6      Data saved with a previous flowsheet row definition    Vision:Within last year and Dental: No current dental problems and Receives regular dental care  Past Medical History:  Diagnosis Date   ADHD    Aneurysm    Anxiety    Depression    History of traumatic brain injury    IBS (irritable bowel syndrome)    no current med.   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   S/P tonsillectomy 11/11/2016   Seasonal allergies    TMJ (dislocation of temporomandibular joint)    Past Surgical History:  Procedure Laterality Date   arthrocentensis Left 03/20/2021   INTRAUTERINE DEVICE (IUD) INSERTION     02-27-15 paragard  insertion   TONSILLECTOMY N/A 11/11/2016   Procedure: TONSILLECTOMY;  Surgeon: Jesus Oliphant, MD;  Location: [facility];  Service: ENT;  Laterality: N/A;   WISDOM TOOTH EXTRACTION     Social History   Tobacco Use   Smoking status: Never   Smokeless tobacco: Never   Tobacco comments:    Former Production Manager   Vaping status: Former  Substance Use Topics    Alcohol use: Yes    Alcohol/week: 2.0 standard drinks of alcohol    Types: 2 Glasses of wine per week   Drug use: Not Currently    Types: Marijuana   Social History   Socioeconomic History   Marital status: Single    Spouse name: Not on file   Number of children: 0   Years of education: Not on file   Highest education level: Bachelor's degree (e.g., BA, AB, BS)  Occupational History   Not on file  Tobacco Use   Smoking status: Never   Smokeless tobacco: Never   Tobacco comments:    Former Production Manager   Vaping status: Former  Substance and Sexual Activity   Alcohol use: Yes    Alcohol/week: 2.0 standard drinks of alcohol    Types: 2 Glasses of wine per week   Drug use: Not Currently    Types: Marijuana   Sexual activity: Yes    Partners: Male    Birth control/protection: I.U.D.  Other Topics Concern   Not on file  Social History Narrative   Not on file   Social Drivers of Health   Financial Resource Strain: Low Risk  (07/19/2023)  Overall Financial Resource Strain (CARDIA)    Difficulty of Paying Living Expenses: Not hard at all  Food Insecurity: No Food Insecurity (07/19/2023)   Hunger Vital Sign    Worried About Running Out of Food in the Last Year: Never true    Ran Out of Food in the Last Year: Never true  Transportation Needs: No Transportation Needs (07/19/2023)   PRAPARE - Administrator, Civil Service (Medical): No    Lack of Transportation (Non-Medical): No  Physical Activity: Insufficiently Active (07/19/2023)   Exercise Vital Sign    Days of Exercise per Week: 5 days    Minutes of Exercise per Session: 20 min  Stress: Stress Concern Present (07/19/2023)   [employer] of Occupational Health - Occupational Stress Questionnaire    Feeling of Stress : To some extent  Social Connections: Moderately Isolated (07/19/2023)   Social Connection and Isolation Panel    Frequency of Communication with Friends and Family: More than three  times a week    Frequency of Social Gatherings with Friends and Family: Twice a week    Attends Religious Services: 1 to 4 times per year    Active Member of [employer] or Organizations: No    Attends Banker Meetings: Never    Marital Status: Never married  Intimate Partner Violence: Not At Risk (07/19/2023)   Humiliation, Afraid, Rape, and Kick questionnaire    Fear of Current or Ex-Partner: No    Emotionally Abused: No    Physically Abused: No    Sexually Abused: No   Family Status  Relation Name Status   Mother  Alive   Father  Alive   Sister  Alive   Brother  Alive   Brother  Alive   MGM  Alive   MGF  Alive   PGM  Deceased   PGF  Deceased  No partnership data on file   Family History  Problem Relation Age of Onset   Allergies Mother    Anuerysm Father    Stroke Paternal Grandfather    Aneurysm Paternal Grandfather    Allergies  Allergen Reactions   Peanut-Containing Drug Products Shortness Of Breath and Itching    TREE NUTS - ITCHING OF MOUTH    Shellfish Allergy Shortness Of Breath and Itching    ITCHING OF MOUTH   Penicillins Hives   Sulfa Antibiotics Hives     Patient Care Team: Billy Philippe SAUNDERS, NP as PCP - General (Family Medicine) Malcom Brightly, MD as Consulting Physician (Neurology)   Outpatient Medications Prior to Visit  Medication Sig   ALPRAZolam  (XANAX ) 0.5 MG tablet TAKE 1/2 TO 1 TABLET 2 - 3 TIMES DAILY ONLY IF NEEDED FOR SEVERE ANXIETY/PANIC *MAX 5 DAYS PER WEEK*   amphetamine -dextroamphetamine  (ADDERALL) 30 MG tablet Take 1 tablet by mouth 2 (two) times daily.   cetirizine (ZYRTEC) 10 MG tablet Take 10 mg by mouth as needed for allergies.   cyclobenzaprine  (FLEXERIL ) 10 MG tablet Take  1/2 to 1 tablet   2-3 x /day  as needed for Muscle Spasms   lamoTRIgine  (LAMICTAL ) 100 MG tablet Take 1 tablet (100 mg total) by mouth 2 (two) times daily. TAKE 1 TABLET BY MOUTH TWICE A DAY FOR MOOD   valACYclovir  (VALTREX ) 500 MG tablet Take 1  tablet (500 mg total) by mouth 2 (two) times daily for 9 days.   [DISCONTINUED] Multiple Vitamin (MULTIVITAMIN ADULT PO) Take by mouth daily.   No facility-administered medications prior to visit.  Review of Systems  Gastrointestinal:  Positive for nausea.  Neurological:  Positive for dizziness and tingling.   See HPI above       Objective:   BP 100/60   Pulse 100   Temp 98 F (36.7 C) (Oral)   Ht 5' 3 (1.6 m)   Wt 114 lb (51.7 kg)   SpO2 99%   BMI 20.19 kg/m     Physical Exam Vitals reviewed.  Constitutional:      General: She is not in acute distress.    Appearance: Normal appearance. She is normal weight. She is not ill-appearing, toxic-appearing or diaphoretic.  HENT:     Head: Normocephalic and atraumatic.     Right Ear: Tympanic membrane, ear canal and external ear normal. There is no impacted cerumen.     Left Ear: Tympanic membrane, ear canal and external ear normal. There is no impacted cerumen.     Nose:     Right Sinus: No maxillary sinus tenderness or frontal sinus tenderness.     Left Sinus: No maxillary sinus tenderness or frontal sinus tenderness.     Mouth/Throat:     Mouth: Mucous membranes are moist.     Pharynx: Oropharynx is clear. Uvula midline. No pharyngeal swelling, oropharyngeal exudate, posterior oropharyngeal erythema or uvula swelling.  Eyes:     General:        Right eye: No discharge.        Left eye: No discharge.     Conjunctiva/sclera: Conjunctivae normal.     Pupils: Pupils are equal, round, and reactive to light.  Neck:     Thyroid: No thyromegaly.  Cardiovascular:     Rate and Rhythm: Normal rate and regular rhythm.     Heart sounds: Normal heart sounds. No murmur heard.    No friction rub. No gallop.  Pulmonary:     Effort: Pulmonary effort is normal. No respiratory distress.     Breath sounds: Normal breath sounds.  Abdominal:     General: Abdomen is flat. Bowel sounds are normal. There is no distension.      Palpations: Abdomen is soft. There is no mass.     Tenderness: There is no abdominal tenderness.  Musculoskeletal:        General: Normal range of motion.     Cervical back: Normal range of motion.     Right lower leg: No edema.     Left lower leg: No edema.  Lymphadenopathy:     Head:     Right side of head: No submental or submandibular adenopathy.     Left side of head: No submental or submandibular adenopathy.     Cervical: No cervical adenopathy.  Skin:    General: Skin is warm and dry.  Neurological:     General: No focal deficit present.     Mental Status: She is alert and oriented to person, place, and time. Mental status is at baseline.     Motor: No weakness.     Gait: Gait normal.  Psychiatric:        Mood and Affect: Mood normal.        Behavior: Behavior normal.        Thought Content: Thought content normal.        Judgment: Judgment normal.        Assessment & Plan:    Routine Health Maintenance and Physical Exam  Immunization History  Administered Date(s) Administered   HPV Quadrivalent 03/02/2010   Hepatitis B  17-Jul-1995, 05/22/1994, 01/07/1995   Influenza,inj,Quad PF,6+ Mos 01/31/2018, 01/07/2021   PFIZER(Purple Top)SARS-COV-2 Vaccination 06/22/2019, 07/17/2019   PPD Test 07/29/2017   Tdap 09/25/2010    Health Maintenance  Topic Date Due   COVID-19 Vaccine (3 - Pfizer risk series) 08/14/2019   DTaP/Tdap/Td (2 - Td or Tdap) 09/24/2020   Influenza Vaccine  10/01/2023   Pneumococcal Vaccine (1 of 2 - PCV) 01/07/2041 (Originally 04/03/2013)   Cervical Cancer Screening (Pap smear)  05/02/2024   Hepatitis B Vaccines 19-59 Average Risk  Completed   Hepatitis C Screening  Completed   HIV Screening  Completed   Meningococcal B Vaccine  Aged Out   HPV VACCINES  Discontinued    Discussed health benefits of physical activity, and encouraged her to engage in regular exercise appropriate for her age and condition.  Annual physical exam -     CBC with  Differential/Platelet -     Comprehensive metabolic panel with GFR -     TSH  Anxiety, generalized -     DRUG MONITOR, PANEL 1, SCREEN, URINE  Attention deficit hyperactivity disorder (ADHD), unspecified ADHD type -     DRUG MONITOR, PANEL 1, SCREEN, URINE  B12 deficiency -     Vitamin B12  Vitamin D  deficiency -     VITAMIN D  25 Hydroxy (Vit-D Deficiency, Fractures)  Thrombocytopenia -     CBC with Differential/Platelet  1.Review Health Maintenance:  -Covid booster: Declines  -Influenza vaccine: Declines  -Tdap vaccine: Declines, discuss about to obtain if an injury occurs  -Hep B vaccine: More like she had;  2.Physical exam completed today.  3.Recommend to continue with a healthy diet and regular exercise.  4.Continue all medications. 5.Ordered urine drug screen for management of Alprazolam  for anxiety and Adderall for ADHD.  6.Ordered labs, except lipids due to not fasting. Office will call with lab results and will be available via MyChart.  7.Follow up in 3 months or sooner with further concerns.  Return in about 3 months (around 05/07/2024) for chronic management.    Keeven Matty, NP

## 2024-02-08 ENCOUNTER — Other Ambulatory Visit (HOSPITAL_COMMUNITY): Payer: Self-pay

## 2024-02-08 ENCOUNTER — Ambulatory Visit: Payer: Self-pay | Admitting: Family Medicine

## 2024-02-08 DIAGNOSIS — E538 Deficiency of other specified B group vitamins: Secondary | ICD-10-CM

## 2024-02-08 DIAGNOSIS — E559 Vitamin D deficiency, unspecified: Secondary | ICD-10-CM

## 2024-02-08 LAB — DRUG MONITOR, PANEL 1, SCREEN, URINE
Amphetamines: POSITIVE ng/mL — AB (ref <500)
Barbiturates: NEGATIVE ng/mL (ref <300)
Benzodiazepines: POSITIVE ng/mL — AB (ref <100)
Cocaine Metabolite: NEGATIVE ng/mL (ref <150)
Creatinine: 136.9 mg/dL (ref >=20.0)
Marijuana Metabolite: POSITIVE ng/mL — AB (ref <20)
Methadone Metabolite: NEGATIVE ng/mL (ref <100)
Opiates: NEGATIVE ng/mL (ref <100)
Oxidant: NEGATIVE ug/mL (ref <200)
Oxycodone: NEGATIVE ng/mL (ref <100)
Phencyclidine: NEGATIVE ng/mL (ref <25)
pH: 5.3 (ref 4.5–9.0)

## 2024-02-08 LAB — DM TEMPLATE

## 2024-02-10 ENCOUNTER — Telehealth: Payer: Self-pay

## 2024-02-10 NOTE — Telephone Encounter (Signed)
 Mom called for an estimate for office visit without insurance.

## 2024-02-15 ENCOUNTER — Ambulatory Visit: Payer: Self-pay | Admitting: Family Medicine

## 2024-03-06 ENCOUNTER — Other Ambulatory Visit: Payer: Self-pay | Admitting: Family Medicine

## 2024-03-06 DIAGNOSIS — F319 Bipolar disorder, unspecified: Secondary | ICD-10-CM

## 2024-03-06 DIAGNOSIS — M542 Cervicalgia: Secondary | ICD-10-CM

## 2024-03-06 DIAGNOSIS — F909 Attention-deficit hyperactivity disorder, unspecified type: Secondary | ICD-10-CM

## 2024-03-06 DIAGNOSIS — E559 Vitamin D deficiency, unspecified: Secondary | ICD-10-CM

## 2024-03-06 DIAGNOSIS — F411 Generalized anxiety disorder: Secondary | ICD-10-CM

## 2024-03-06 DIAGNOSIS — F409 Phobic anxiety disorder, unspecified: Secondary | ICD-10-CM

## 2024-03-06 DIAGNOSIS — E538 Deficiency of other specified B group vitamins: Secondary | ICD-10-CM

## 2024-03-06 MED ORDER — CYCLOBENZAPRINE HCL 10 MG PO TABS: ORAL_TABLET | ORAL | 1 refills | Status: AC

## 2024-03-06 MED ORDER — LAMOTRIGINE 100 MG PO TABS: 100.0000 mg | ORAL_TABLET | Freq: Two times a day (BID) | ORAL | 2 refills | Status: AC

## 2024-03-06 MED ORDER — ALPRAZOLAM 0.5 MG PO TABS: ORAL_TABLET | ORAL | 0 refills | Status: AC

## 2024-03-06 MED ORDER — AMPHETAMINE-DEXTROAMPHETAMINE 30 MG PO TABS: 30.0000 mg | ORAL_TABLET | Freq: Two times a day (BID) | ORAL | 0 refills | Status: AC

## 2024-03-06 MED ORDER — VITAMIN D3 50 MCG (2000 UT) PO CAPS: 2000.0000 [IU] | ORAL_CAPSULE | Freq: Every day | ORAL | 0 refills | Status: AC

## 2024-03-06 MED ORDER — VITAMIN B-12 1000 MCG PO TABS: 1000.0000 ug | ORAL_TABLET | Freq: Every day | ORAL | 0 refills | Status: AC

## 2024-03-06 NOTE — Progress Notes (Signed)
 Requesting medication refill. PDMP reviewed for management of Alprazolam  and Adderall. Last refill was 02/01/2024. UDS and controlled substance is UTD.
